# Patient Record
Sex: Male | Born: 1980 | Race: Black or African American | Hispanic: No | Marital: Single | Smoking: Current every day smoker
Health system: Southern US, Community
[De-identification: ages and names within clinical notes are randomized; demographics above are authoritative.]

## PROBLEM LIST (undated history)

## (undated) DIAGNOSIS — R519 Headache, unspecified: Secondary | ICD-10-CM

## (undated) DIAGNOSIS — T07XXXA Unspecified multiple injuries, initial encounter: Secondary | ICD-10-CM

## (undated) DIAGNOSIS — J45909 Unspecified asthma, uncomplicated: Secondary | ICD-10-CM

## (undated) DIAGNOSIS — S82891A Other fracture of right lower leg, initial encounter for closed fracture: Secondary | ICD-10-CM

## (undated) DIAGNOSIS — F319 Bipolar disorder, unspecified: Secondary | ICD-10-CM

## (undated) DIAGNOSIS — R51 Headache: Secondary | ICD-10-CM

## (undated) DIAGNOSIS — Z8709 Personal history of other diseases of the respiratory system: Secondary | ICD-10-CM

## (undated) DIAGNOSIS — R0781 Pleurodynia: Secondary | ICD-10-CM

## (undated) HISTORY — PX: COLONOSCOPY: SHX174

## (undated) HISTORY — PX: APPENDECTOMY: SHX54

---
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
## 2004-03-21 ENCOUNTER — Emergency Department (HOSPITAL_COMMUNITY): Admission: EM | Admit: 2004-03-21 | Discharge: 2004-03-21 | Payer: Self-pay | Admitting: Emergency Medicine

## 2004-11-10 ENCOUNTER — Inpatient Hospital Stay (HOSPITAL_COMMUNITY): Admission: AD | Admit: 2004-11-10 | Discharge: 2004-11-13 | Payer: Self-pay | Admitting: Psychiatry

## 2004-11-10 ENCOUNTER — Ambulatory Visit: Payer: Self-pay | Admitting: Psychiatry

## 2007-12-14 ENCOUNTER — Emergency Department (HOSPITAL_COMMUNITY): Admission: EM | Admit: 2007-12-14 | Discharge: 2007-12-14 | Payer: Self-pay | Admitting: Emergency Medicine

## 2010-01-27 ENCOUNTER — Emergency Department (HOSPITAL_COMMUNITY): Admission: EM | Admit: 2010-01-27 | Discharge: 2010-01-27 | Payer: Self-pay | Admitting: Family Medicine

## 2010-07-27 LAB — GC/CHLAMYDIA PROBE AMP, GENITAL
Chlamydia, DNA Probe: NEGATIVE
GC Probe Amp, Genital: NEGATIVE

## 2010-09-29 NOTE — H&P (Signed)
[facility]  Patient:    [name], [name] Visit Number: [id number] MRN: [id number]          Service Type: DSU Location: [facility] Attending Physician:  Twana First Dictated by:   Elana Alm Thurston Hole, M.D. Admit Date:  12/02/2001 Discharge Date: 12/03/2001                           History and Physical  PREOPERATIVE DIAGNOSES: 1. Left knee anterior cruciate ligament tear. 2. Left knee medial meniscus tear.  POSTOPERATIVE DIAGNOSES: 1. Left knee anterior cruciate ligament tear. 2. Left knee medial meniscus tear.  PROCEDURE: 1. Left knee EUA followed by arthroscopically-assisted endoscopic    bone-pateller tendon-bone autograft ACL reconstruction using 9 x 25 mm    femoral interference BioScrew and 9 x 25 mm tibial interference BioScrew. 2. Left knee medial meniscal debridement.  SURGEON: Elana Alm. Thurston Hole, M.D.  ASSISTANT:  Julien Girt, P.A.  ANESTHESIA:  General.  OPERATIVE TIME:  One hour 20 minutes.  COMPLICATIONS:  None.  INDICATION FOR PROCEDURE:  The patient is an 30 year old who sustained an injury to his left knee playing basketball approximately one month ago with a twisting pivot-shift injury.  Exam and MRI has revealed a complete ACL tear and partial medial meniscus tear.  He is now to undergo arthroscopy and ACL reconstruction.  DESCRIPTION:  The patient was brought to the operating room on December 02, 2001, placed on the operative table in a supine position.  After an adequate level of general anesthesia was obtained, his left knee was examined under anesthesia; had full range of motion, 3+ Lachman, positive pivot shift, knee stable to varus-valgus and posterior stress with normal patella tracking.  The left knee was sterilely injected with 0.25% Marcaine with epinephrine.  The left leg was prepped using sterile DuraPrep and draped using sterile technique.  Initially, the arthroscopy was performed.  Through an  inferolateral portal the arthroscope with a pump attached was placed, and through an inferomedial portal an arthroscopic probe was placed.  On initial inspection of the medial compartment, the articular cartilage, medial femoral condyle, and medial tibial plateau were found to be normal.  The medial meniscus showed a partial tear of the superior portion of the medial meniscus which was stable.  I performed a trephination procedure on this with an 18-gauge needle to improve vascularity and improve healing potential.  This piece did not need to be resected; it was stable.  The rest of the meniscus was intact.  The ACL showed a complete tear of the mid-substance with significant anterior laxity.  The posterior cruciate was intact and stable.  The ACL stumps were thoroughly debrided and a notchplasty was performed.  The lateral compartment was inspected.  The articular cartilage, lateral femoral condyle, lateral tibial plateau was normal.  The lateral meniscus was probed and this was found to be normal, as well as a the popliteus tendon.  The patellofemoral joint articular cartilage was normal.  The patella tracked normally.  The medial and lateral gutters were free of pathology.  At this point the ACL graft was harvested through a 5 cm longitudinal incision based over the patellar tendon.  The underlying subcutaneous tissues were incised in line with the skin incision.  The patellar tendon was measured.  It measured 33 mm in width and its central 10 mm was harvested with 10 x 25 mm of patellar bone and tibial tubercle bone.  After  this was done then through a 1.5 cm anterior medial proximal tibial incision using a tibial drill guide, a Steinmann pin was drilled into the ACL insertion point on the tibial plateau and then overdrilled with an 11 mm drill.  Through this hole the posterior femoral guide was placed into the posterior femoral notch, the Steinmann pin drilled in the ACL origin point,  and then overdrillled with an 11 mm dril to a depth of 28 mm.  After this was done a double pin passer was brought up through the tibial hole joint into the femoral tunnel and through the femoral cortex and thigh through a stab wound.  This was used to pass the ACL graft up through the tibial tunnel and joined into the femoral tunnel.  It was locked into position there with a 9 x 25 mm interference BioScrew.  The knee was then brought through a full range of motion.  There was found to be excellent stability of the femoral bone plug and no impingement on the graft.  The tibial bone plug was then locked into its tunnel with a 9 x 25 mm interference BioScrew.  After this was done the knee was tested for stability.  The Lachman and pivot-shift were found to be totally eliminated and the knee could be brought through a full range of motion with no impingement on the graft.  At this point it was felt that all pathology had been satisfactorily addressed. The wounds were irrigated.  The patellar tendon defect was closed loosely with 0 Vicryl, subcutaneous tissues closed with 2-0 Vicryl, subcuticular layer closed with 3-0 Prolene, Steri-Strips were applied, sterile dressings and a long-leg splint applied.  The patient then had a femoral nerve block placed by anesthesia for postoperative pain control.  He was then awakened and taken to recovery room in stable condition.  Needle and sponge counts were correct x2 at the end of the case.  FOLLOW-UP CARE:  The patient will be followed overnight at the recovery care center for IV pain control and neurovascular monitoring, CPM and ice machine used.  Discharge tomorrow on Percocet and Naprosyn with a home CPM and ice machine.  See him back in the office in a week for sutures out and follow-up. Dictated by:   Elana Alm Thurston Hole, M.D. Attending Physician:  Twana First DD:  12/02/01 TD:  12/06/01 Job: [id number]

## 2010-09-29 NOTE — H&P (Signed)
NAMEKRISTA, SOM NO.:  [id number]   MEDICAL RECORD NO.:  [id number]          PATIENT TYPE:  IPS   LOCATION:  0401                          FACILITY:  [facility]   PHYSICIAN:  Jeanice Lim, M.D. DATE OF BIRTH:  03/04/1981   DATE OF ADMISSION:  11/10/2004  DATE OF DISCHARGE:                         PSYCHIATRIC ADMISSION ASSESSMENT   IDENTIFYING INFORMATION:  This is a 30 year old single African-American  male.  Apparently his mother became concerned about him yesterday.  The  patient apparently threatened to cut his throat with a knife.  He was  extremely depressed.  The stressor was his girlfriend of 2 years breaking up  with him about 4 days ago and the girlfriend is refusing to have any  communication with him.  The patient is also on probation secondary to  weapons charge.  Details are unknown.   He has a history for depressive symptoms with no prior treatment.  His  mother petitioned for commitment.  The patient's mother feels the patient is  homicidal towards his girlfriend even though he has not done anything  threatening.  Yesterday, the patient was guarded and not answering most  questions.  There was no history of prior violence.   PAST PSYCHIATRIC HISTORY:  He has no prior inpatient or outpatient  treatment.   SOCIAL HISTORY:  He graduated high school in 2003.  He works for [employer].   FAMILY HISTORY:  He denies.   ALCOHOL AND DRUG ABUSE:  He smokes 1/2 pack of cigarettes per day, and he  states he has 2 beers a day.   PAST MEDICAL HISTORY:  Primary care Rajni Holsworth:  He does not have one.  Medical problems:  He has none.  Medications:  He is not prescribed.   ALLERGIES:  No known drug allergies.   POSITIVE PHYSICAL FINDINGS:  PHYSICAL EXAMINATION:  Unremarkable.  He is a  healthy well-nourished, well-developed African-American male who appears his  stated age of 41.   MENTAL STATUS EXAM:  He is alert and oriented x3.  He was  appropriately  groomed, dressed and nourished.  His speech was a little bit slow but  otherwise normal rate, rhythm and tone.  His mood is somewhat depressed and  anxious, appropriate to the situation.  His thought processes are clear,  rational and goal oriented.  He is requesting discharge.  Judgment and  insight are fair.  Concentration and memory are intact.  Intelligence is at  least average.  Specifically, he denies suicidal or homicidal ideation.  He  denies auditory or visual hallucinations.   ADMISSION DIAGNOSES:  AXIS I:  Adjustment disorder with depressed mood.  Rule out major depressive disorder.  AXIS II:  Deferred.  AXIS III:  None.  AXIS IV:  Severe.  Relationship issues, also on probation.  AXIS V:  Global assessment of function is 30.   PLAN:  Plan is to admit for safety and stabilization.  Toward that end, we  will start Wellbutrin XL 150 p.o. q.a.m.  This will also help him give up  the cigarettes.  We will identify outpatient therapy.  MD/MEDQ  D:  11/11/2004  T:  11/11/2004  Job:  [id number]

## 2010-09-29 NOTE — Discharge Summary (Signed)
[name], [name] NO.:  [id number]   MEDICAL RECORD NO.:  [id number]          PATIENT TYPE:  IPS   LOCATION:  0301                          [facility]ACILITY:  [facility]   PHYSICIAN:  Jeanice Lim, M.D. DATE OF BIRTH:  [birth year]   DATE OF ADMISSION:  11/10/2004  DATE OF DISCHARGE:  11/13/2004                                 DISCHARGE SUMMARY   IDENTIFYING DATA:  This is a 30 year old single African-American male.  Mother became concerned about him yesterday, apparently threatening to cut  throat with knife, extremely depressed, __________ with girlfriend of two  years, breaking up with him four days ago, girlfriend refusing to have any  communication with him.  The patient was on probation secondary to a weapon  charge.  The patient had depressive symptoms and no previous treatment.  Mother petitioner for treatment and felt the patient was homicidal toward  girlfriend even though he had not actually done anything with intent  regarding violence.   PAST PSYCHIATRIC HISTORY:  Negative.  He drinks two beers per day as per  patient.   No known drug allergies.   Physical and neurologic exam within normal limits.  Routine admission labs  within normal limits.   MENTAL STATUS EXAM:  Alert and oriented, appropriately and dressed and  groomed and nourished.  Speech a little bit slow, otherwise normal.  Mood  somewhat depressed and anxious.  Thought processes were goal-directed,  judgment and insight fair.  Denied suicidal or homicidal ideation.  Cognitively intact.   ADMISSION DIAGNOSES:  Axis I:  Adjustment disorder with depressed mood; rule  out major depressive disorder, moderate and recurrent, with no previous  treatment.  Axis II:  Deferred.  Axis III:  None.  Axis IV:  Severe, relationship issues and also on probation, legal issues.  Axis V:  GAF is 30/55-60.   The patient was admitted and ordered routine p.r.n. medications, underwent  further monitoring, and  was encouraged to participate in individual, group  and milieu therapy.  The patient was monitored for safety.  Optimized on  medications.  Depakote was optimized.  Valproic acid level checked.  Discharge planning and support system evaluated.  The patient reported  improvement in symptoms ___________ in improved condition, no dangerous  ideation.  Medication education was given again and the patient was  discharged on:   1.  Wellbutrin XR 150 mg q.a.m.  2.  Depakote ER 500 mg q.h.s.  3.  Ambien 10 mg q.h.s. p.r.n. insomnia.   Follow up with crisis emergency services on July 5 and [facility].  The patient was discharged in improved condition with no risk  issues.       JEM/MEDQ  D:  12/21/2004  T:  12/21/2004  Job:  [id number]

## 2010-11-27 ENCOUNTER — Emergency Department (HOSPITAL_COMMUNITY)
Admission: EM | Admit: 2010-11-27 | Discharge: 2010-11-27 | Disposition: A | Discharge status: Home / Self Care | Payer: Self-pay | Attending: Emergency Medicine | Admitting: Emergency Medicine

## 2010-11-27 DIAGNOSIS — L0231 Cutaneous abscess of buttock: Secondary | ICD-10-CM | POA: Insufficient documentation

## 2012-10-15 ENCOUNTER — Encounter (HOSPITAL_COMMUNITY): Payer: Self-pay | Admitting: Emergency Medicine

## 2012-10-15 ENCOUNTER — Emergency Department (HOSPITAL_COMMUNITY)
Admission: EM | Admit: 2012-10-15 | Discharge: 2012-10-15 | Disposition: A | Discharge status: Home / Self Care | Payer: Self-pay | Attending: Emergency Medicine | Admitting: Emergency Medicine

## 2012-10-15 DIAGNOSIS — F17200 Nicotine dependence, unspecified, uncomplicated: Secondary | ICD-10-CM | POA: Insufficient documentation

## 2012-10-15 DIAGNOSIS — K625 Hemorrhage of anus and rectum: Secondary | ICD-10-CM

## 2012-10-15 DIAGNOSIS — K921 Melena: Secondary | ICD-10-CM | POA: Insufficient documentation

## 2012-10-15 LAB — CBC
Hemoglobin: 15 g/dL (ref 13.0–17.0)
Platelets: 290 10*3/uL (ref 150–400)
RBC: 4.84 MIL/uL (ref 4.22–5.81)
WBC: 6.3 10*3/uL (ref 4.0–10.5)

## 2012-10-15 LAB — COMPREHENSIVE METABOLIC PANEL
ALT: 11 U/L (ref 0–53)
AST: 16 U/L (ref 0–37)
Alkaline Phosphatase: 74 U/L (ref 39–117)
CO2: 30 mEq/L (ref 19–32)
GFR calc Af Amer: >90 mL/min (ref >90)
GFR calc non Af Amer: >90 mL/min (ref >90)
Glucose, Bld: 74 mg/dL (ref 70–99)
Potassium: 3.7 mEq/L (ref 3.5–5.1)
Sodium: 139 mEq/L (ref 135–145)

## 2012-10-15 LAB — OCCULT BLOOD, POC DEVICE: Fecal Occult Bld: POSITIVE — AB

## 2012-10-15 NOTE — ED Notes (Signed)
Per patient, feels weak and slight dizziness

## 2012-10-15 NOTE — ED Provider Notes (Signed)
History     CSN: [id number]  Arrival date & time 10/15/12  1814   First MD Initiated Contact with Patient 10/15/12 1823      Chief Complaint  Patient presents with  . Rectal Bleeding    (Consider location/radiation/quality/duration/timing/severity/associated sxs/prior treatment) Patient is a 32 y.o. male presenting with hematochezia. The history is provided by the patient and medical records. No language interpreter was used.  Rectal Bleeding Quality:  Bright red Amount:  Moderate Duration:  2 days Timing:  Intermittent Progression:  Worsening Chronicity:  New Context: defecation   Context: not anal fissures, not anal penetration, not constipation, not diarrhea, not foreign body, not hemorrhoids, not rectal injury, not rectal pain and not spontaneously   Similar prior episodes: no   Relieved by:  Nothing Worsened by:  Defecation Ineffective treatments:  Time Associated symptoms: no abdominal pain, no dizziness, no epistaxis, no fever, no hematemesis, no light-headedness, no loss of consciousness, no recent illness and no vomiting   Risk factors: no anticoagulant use, no hx of colorectal cancer, no hx of colorectal surgery, no hx of IBD, no liver disease, no NSAID use and no steroid use     JAEVIN MEDEARIS is a 32 y.o. male  with no known medical history presents to the Emergency Department complaining of gradual, persistent, progressively worsening bright red blood per rectum with defecation only on for 2 days ago. Patient denies constipation, hard stools, straining with BMs.  Pt states he normally has several bowel movements each day, which are soft in nature and have not changed.  Pt states painless BRBPR for 2 days without associated N/V/D, weakness, dizziness, abdominal pain, SOB, palpitations. Nothing makes the symptoms better or worse.  He denise personal of family Hx of Lupus, Chron's, UC or other autoimmune diseases.  Pt takes ibuprofen or Goody's powder for headache on  occasion but less than 3x per week.  Pt also denies hematuria, bleeding gums, easy bruising/bleeding.  Pt denies fever, chills, headache, CP, night sweats, weight loss, rectal itching or purulent discharge.  History reviewed. No pertinent past medical history.  Past Surgical History  Procedure Laterality Date  . Appendectomy      History reviewed. No pertinent family history.  History  Substance Use Topics  . Smoking status: Current Some Day Smoker  . Smokeless tobacco: Not on file  . Alcohol Use: Yes      Review of Systems  Constitutional: Negative for fever, diaphoresis, appetite change, fatigue and unexpected weight change.  HENT: Negative for nosebleeds, mouth sores and neck stiffness.   Eyes: Negative for visual disturbance.  Respiratory: Negative for cough, chest tightness, shortness of breath and wheezing.   Cardiovascular: Negative for chest pain.  Gastrointestinal: Positive for blood in stool, hematochezia and anal bleeding. Negative for nausea, vomiting, abdominal pain, diarrhea, constipation, abdominal distention, rectal pain and hematemesis.  Endocrine: Negative for polydipsia, polyphagia and polyuria.  Genitourinary: Negative for dysuria, urgency, frequency and hematuria.  Musculoskeletal: Negative for back pain.  Skin: Negative for rash.  Allergic/Immunologic: Negative for immunocompromised state.  Neurological: Negative for dizziness, loss of consciousness, syncope, light-headedness and headaches.  Hematological: Does not bruise/bleed easily.  Psychiatric/Behavioral: Negative for sleep disturbance. The patient is not nervous/anxious.     Allergies  Review of patient's allergies indicates no known allergies.  Home Medications  No current outpatient prescriptions on file.  BP 125/85  Pulse 88  Temp(Src) 99.7 F (37.6 C) (Oral)  Resp 16  SpO2 100%  Physical Exam  Nursing  note and vitals reviewed. Constitutional: He is oriented to person, place, and  time. He appears well-developed and well-nourished. No distress.  HENT:  Head: Normocephalic and atraumatic.  Mouth/Throat: Oropharynx is clear and moist. No oropharyngeal exudate.  Eyes: Conjunctivae are normal. Pupils are equal, round, and reactive to light. No scleral icterus.  Neck: Normal range of motion. Neck supple.  Cardiovascular: Normal rate, regular rhythm, normal heart sounds and intact distal pulses.   No murmur heard. Pulmonary/Chest: Effort normal and breath sounds normal. No respiratory distress. He has no wheezes. He has no rales. He exhibits no tenderness.  Abdominal: Soft. Bowel sounds are normal. He exhibits no distension and no mass. There is no tenderness. There is no rebound and no guarding.  Genitourinary: Rectal exam shows no external hemorrhoid, no internal hemorrhoid, no fissure, no mass, no tenderness and anal tone normal. Guaiac positive stool. Prostate is not enlarged and not tender.  External: no skin changes, fissures, scars, or hemorrhoids noted Internal: no masses, induration, point tenderness; Prostate is not enlarged, boggy or tender Bright red blood per rectum on rectal exam   Musculoskeletal: Normal range of motion. He exhibits no edema.  Neurological: He is alert and oriented to person, place, and time. He exhibits normal muscle tone. Coordination normal.  Speech is clear and goal oriented Moves extremities without ataxia  Skin: Skin is warm and dry. No rash noted. He is not diaphoretic. No erythema.  Psychiatric: He has a normal mood and affect.    ED Course  Procedures (including critical care time)  Labs Reviewed  OCCULT BLOOD, POC DEVICE - Abnormal; Notable for the following:    Fecal Occult Bld POSITIVE (*)    All other components within normal limits  CBC  COMPREHENSIVE METABOLIC PANEL  PROTIME-INR  APTT   No results found.   1. Bright red rectal bleeding       MDM  Rise Patience presents with painless BRBPR.  Likely internal  hemorrhoids; but will consider diverticulosis, but this is unlikely in pt age.  Also consider UC or Chron's but pt is without abdominal pain and these are unlikely.  No internal hemorrhoids felt on exam, but pt fear of the exam made it difficult.  NO hard stool in rectal vault and I do not believe pt is constipated or has an impaction.  Labs and vitals reviewed. Pt is not tachycardic, febrile or orthostatic.  Pt is without anemia, electrolyte imbalance or clotting disorder.   I reviewed available ER/hospitalization records through the EMR.  Pt is to follow-up with GI for further evaluation.  I have also discussed reasons to return immediately to the ER.  Patient expresses understanding and agrees with plan.           Dahlia Client Ziona Wickens, PA-C 10/15/12 2053

## 2012-10-15 NOTE — ED Provider Notes (Signed)
Medical screening examination/treatment/procedure(s) were performed by non-physician practitioner and as supervising physician I was immediately available for consultation/collaboration.   Sederick Jacobsen R Dorien Mayotte, MD 10/15/12 2058 

## 2012-10-15 NOTE — ED Notes (Signed)
Per patient, having bright red blood with each BM, no abdominal pain/cramping

## 2012-10-16 ENCOUNTER — Encounter: Payer: Self-pay | Admitting: Internal Medicine

## 2012-10-16 ENCOUNTER — Telehealth: Payer: Self-pay | Admitting: Internal Medicine

## 2012-10-16 NOTE — Telephone Encounter (Signed)
Pt seen in ER yesterday for BRB x 2 days with BM's; guaiac + in ER. ER notes state pt takes Goody's powder for HA and ibuprofen. Mom initiated the call and stated pt just "pulled 18 months" and has no insurance. Checked with Darcey Nora, RN and we are on unassigned call at [facility] this month. Pt given an appt with Mike Gip, PA in am and was instructed he should bring some part of $184; mom stated understanding.

## 2012-10-17 ENCOUNTER — Other Ambulatory Visit: Payer: Self-pay | Admitting: *Deleted

## 2012-10-17 ENCOUNTER — Encounter: Payer: Self-pay | Admitting: Physician Assistant

## 2012-10-17 ENCOUNTER — Ambulatory Visit (INDEPENDENT_AMBULATORY_CARE_PROVIDER_SITE_OTHER): Payer: Self-pay | Admitting: Physician Assistant

## 2012-10-17 VITALS — BP 100/70 | HR 60 | Ht 71.0 in | Wt 197.0 lb

## 2012-10-17 DIAGNOSIS — R109 Unspecified abdominal pain: Secondary | ICD-10-CM

## 2012-10-17 DIAGNOSIS — K625 Hemorrhage of anus and rectum: Secondary | ICD-10-CM

## 2012-10-17 MED ORDER — MOVIPREP 100 G PO SOLR: 1.0000 | Freq: Once | ORAL | Status: DC

## 2012-10-17 NOTE — Progress Notes (Signed)
Subjective:    Patient ID: [name], male    DOB: 28-Jan-1981, 32 y.o.   MRN: [id number]  HPI  [name] is a generally healthy 32 year old African American male who was referred here after an ER visit on 10/15/2012 for rectal bleeding . He says he has no history of any GI issues. About a week ago he had a sharp pain in his low mid abdomen which lasted for several minutes then resolved. He says been noticing intermittent mild lower abdominal  discomfort below his navel. He had an episode on Monday, 10/13/2012 with a bowel movement which had a lot of bright red blood mixed in with it and bright red blood noted in the commode. He says the stool was brown. He had a second episode on Wednesday, 10/14/2012 again with a formed brown bowel movement with bright red blood mixed in and blood in the commode. He had a second bowel movement that same day which was just blood. Bowel movement yesterday without any visible blood and has not had a bowel movement today. He has not had any rectal discomfort pressure etc. no changes in his bowel habits constipation straining etc. He does not use any regular aspirin or NSAIDs. His family history is negative for colon cancer polyps as far as he does wear. Evaluation in the emergency room with labs showed a WBC of 6.3 hemoglobin 15 hematocrit of 43.4 and MCV of 89    Review of Systems  Constitutional: Negative.   HENT: Negative.   Eyes: Negative.   Respiratory: Negative.   Cardiovascular: Negative.   Gastrointestinal: Positive for abdominal pain and blood in stool.  Endocrine: Negative.   Genitourinary: Negative.   Musculoskeletal: Negative.   Allergic/Immunologic: Negative.   Neurological: Negative.   Hematological: Negative.   Psychiatric/Behavioral: Negative.    No outpatient encounter prescriptions on file as of 10/17/2012.   No facility-administered encounter medications on file as of 10/17/2012.       No Known Allergies There are no active problems to  display for this patient. Healthy family history is negative for Colon cancer and Colon polyps. History  Substance Use Topics  . Smoking status: Current Every Day Smoker  . Smokeless tobacco: Never Used  . Alcohol Use: No     Comment: former drinker but right now not drinking      Objective:   Physical Exam  well-developed African American male in no acute distress. Blood pressure 100/70 pulse 60 height 5 foot 11 weight 197. HEENT; nontraumatic normocephalic EOMI PERRLA sclera anicteric,Neck; Supple no JVD, Cardiovascular; regular rate and rhythm with S1-S2 no murmur or gallop, Pulm; clear bilaterally, Abdomen ;soft basically nontender, there is no palpable mass or hepatosplenomegaly, bowel sounds are present, Recta;l exam not repeated this was done in the ER on 10/15/2012 with bright red blood noted on exam glove in no external hemorrhoids noted., Extremities ;no clubbing cyanosis or edema skin warm and dry, Psych ;mood and affect normal and appropriate.        Assessment & Plan:  #36 32 year old generally healthy African American male with recent intermittent lower abdominal pain and 4-5 day history of intermittent bright red blood mixed with his bowel movements. Etiology of bleeding is not clear, this may represent internal hemorrhoidal bleeding however with blood mixed in with the bowel movement will need to rule out proctitis/colitis and/or occult colon lesion.  Plan; Will start Anusol-HC suppositories empirically at bedtime x7 days then as needed Schedule for colonoscopy with Dr. Kathlee Nations discussed in  detail with the patient and he is agreeable to proceed. Patient is advised that if he continues to have significant amounts of blood noted in his stool prior to his colonoscopy to call back and at that point would need at least repeat labs.

## 2012-10-17 NOTE — Progress Notes (Signed)
I agree with the plan in this note.

## 2012-10-21 ENCOUNTER — Encounter: Payer: Self-pay | Admitting: Gastroenterology

## 2012-10-21 ENCOUNTER — Ambulatory Visit (AMBULATORY_SURGERY_CENTER): Payer: Self-pay | Admitting: Gastroenterology

## 2012-10-21 VITALS — BP 100/54 | HR 62 | Temp 97.5°F | Resp 16 | Ht 71.0 in | Wt 197.0 lb

## 2012-10-21 DIAGNOSIS — K648 Other hemorrhoids: Secondary | ICD-10-CM

## 2012-10-21 DIAGNOSIS — R109 Unspecified abdominal pain: Secondary | ICD-10-CM

## 2012-10-21 DIAGNOSIS — K625 Hemorrhage of anus and rectum: Secondary | ICD-10-CM

## 2012-10-21 DIAGNOSIS — K644 Residual hemorrhoidal skin tags: Secondary | ICD-10-CM

## 2012-10-21 MED ORDER — SODIUM CHLORIDE 0.9 % IV SOLN: 500.0000 mL | INTRAVENOUS | Status: DC

## 2012-10-21 NOTE — Progress Notes (Signed)
Patient did not have preoperative order for IV antibiotic SSI prophylaxis. (G8918)  Patient did not experience any of the following events: a burn prior to discharge; a fall within the facility; wrong site/side/patient/procedure/implant event; or a hospital transfer or hospital admission upon discharge from the facility. (G8907)  

## 2012-10-21 NOTE — Patient Instructions (Addendum)
One of your biggest health concerns is your smoking.  This increases your risk for most cancers and serious cardiovascular diseases such as strokes, heart attacks.  You should try your best to stop.  If you need assistance, please contact your PCP or Smoking Cessation Class at [place] ([phone]) or [place] (1-800-QUIT-NOW).  YOU HAD AN ENDOSCOPIC PROCEDURE TODAY AT THE [facility]: Refer to the procedure report that was given to you for any specific questions about what was found during the examination.  If the procedure report does not answer your questions, please call your gastroenterologist to clarify.  If you requested that your care partner not be given the details of your procedure findings, then the procedure report has been included in a sealed envelope for you to review at your convenience later.  YOU SHOULD EXPECT: Some feelings of bloating in the abdomen. Passage of more gas than usual.  Walking can help get rid of the air that was put into your GI tract during the procedure and reduce the bloating. If you had a lower endoscopy (such as a colonoscopy or flexible sigmoidoscopy) you may notice spotting of blood in your stool or on the toilet paper. If you underwent a bowel prep for your procedure, then you may not have a normal bowel movement for a few days.  DIET: Your first meal following the procedure should be a light meal and then it is ok to progress to your normal diet.  A half-sandwich or bowl of soup is an example of a good first meal.  Heavy or fried foods are harder to digest and may make you feel nauseous or bloated.  Likewise meals heavy in dairy and vegetables can cause extra gas to form and this can also increase the bloating.  Drink plenty of fluids but you should avoid alcoholic beverages for 24 hours.  ACTIVITY: Your care partner should take you home directly after the procedure.  You should plan to take it easy, moving slowly for the rest of  the day.  You can resume normal activity the day after the procedure however you should NOT DRIVE or use heavy machinery for 24 hours (because of the sedation medicines used during the test).    SYMPTOMS TO REPORT IMMEDIATELY: A gastroenterologist can be reached at any hour.  During normal business hours, 8:30 AM to 5:00 PM Monday through Friday, call [phone].  After hours and on weekends, please call the GI answering service at [phone] who will take a message and have the physician on call contact you.   Following lower endoscopy (colonoscopy or flexible sigmoidoscopy):  Excessive amounts of blood in the stool  Significant tenderness or worsening of abdominal pains  Swelling of the abdomen that is new, acute  Fever of 100F or higher  FOLLOW UP: If any biopsies were taken you will be contacted by phone or by letter within the next 1-3 weeks.  Call your gastroenterologist if you have not heard about the biopsies in 3 weeks.  Our staff will call the home number listed on your records the next business day following your procedure to check on you and address any questions or concerns that you may have at that time regarding the information given to you following your procedure. This is a courtesy call and so if there is no answer at the home number and we have not heard from you through the emergency physician on call, we will assume that you have returned to   your regular daily activities without incident.  SIGNATURES/CONFIDENTIALITY: You and/or your care partner have signed paperwork which will be entered into your electronic medical record.  These signatures attest to the fact that that the information above on your After Visit Summary has been reviewed and is understood.  Full responsibility of the confidentiality of this discharge information lies with you and/or your care-partner. 

## 2012-10-21 NOTE — Op Note (Signed)
[facility] [address] [place], [address]   COLONOSCOPY PROCEDURE REPORT  PATIENT: [name], [name]  MR#: [id number] BIRTHDATE: 06-23-1980 , 31  yrs. old GENDER: Male ENDOSCOPIST: Rachael Fee, MD PROCEDURE DATE:  10/21/2012 PROCEDURE:   Colonoscopy, diagnostic ASA CLASS:   Class II INDICATIONS:recent minor rectal bleeding and lower abdominal discomfort. MEDICATIONS: Fentanyl 75 mcg IV, Versed 7 mg IV, and These medications were titrated to patient response per physician's verbal order  DESCRIPTION OF PROCEDURE:   After the risks benefits and alternatives of the procedure were thoroughly explained, informed consent was obtained.  A digital rectal exam revealed no abnormalities of the rectum.   The LB [id number]  endoscope was introduced through the anus and advanced to the cecum, which was identified by both the appendix and ileocecal valve. No adverse events experienced.   The quality of the prep was good.  The instrument was then slowly withdrawn as the colon was fully examined.   COLON FINDINGS: A normal appearing cecum, ileocecal valve, and appendiceal orifice were identified.  The ascending, hepatic flexure, transverse, splenic flexure, descending, sigmoid colon and rectum appeared unremarkable.  No polyps or cancers were seen. Retroflexed views revealed internal/external hemorrhoids. The time to cecum=3 minutes 43 seconds.  Withdrawal time=5 minutes 10 seconds.  The scope was withdrawn and the procedure completed. COMPLICATIONS: There were no complications.  ENDOSCOPIC IMPRESSION: Normal colon Hemorrhoids; these are the likely source of your recent bleeding  RECOMMENDATIONS: Please call if you have repeated abdominal pains, bleeding.   eSigned:  Rachael Fee, MD 10/21/2012 2:01 PM   cc:

## 2012-10-22 ENCOUNTER — Telehealth: Payer: Self-pay

## 2012-10-22 NOTE — Telephone Encounter (Signed)
Left message on answering machine. 

## 2012-11-06 ENCOUNTER — Ambulatory Visit: Payer: Self-pay | Admitting: Internal Medicine

## 2014-01-24 ENCOUNTER — Inpatient Hospital Stay (HOSPITAL_COMMUNITY)
Admission: EM | Admit: 2014-01-24 | Discharge: 2014-01-28 | DRG: 563 | Disposition: A | Discharge status: Rehabilitation Facility | Payer: Self-pay | Attending: General Surgery | Admitting: General Surgery

## 2014-01-24 ENCOUNTER — Emergency Department (HOSPITAL_COMMUNITY): Payer: Self-pay

## 2014-01-24 ENCOUNTER — Encounter (HOSPITAL_COMMUNITY): Payer: Self-pay | Admitting: Emergency Medicine

## 2014-01-24 DIAGNOSIS — S92101A Unspecified fracture of right talus, initial encounter for closed fracture: Secondary | ICD-10-CM | POA: Diagnosis present

## 2014-01-24 DIAGNOSIS — S92001A Unspecified fracture of right calcaneus, initial encounter for closed fracture: Secondary | ICD-10-CM | POA: Diagnosis present

## 2014-01-24 DIAGNOSIS — S82891A Other fracture of right lower leg, initial encounter for closed fracture: Secondary | ICD-10-CM

## 2014-01-24 DIAGNOSIS — S82831A Other fracture of upper and lower end of right fibula, initial encounter for closed fracture: Secondary | ICD-10-CM

## 2014-01-24 DIAGNOSIS — I959 Hypotension, unspecified: Secondary | ICD-10-CM | POA: Diagnosis present

## 2014-01-24 DIAGNOSIS — S51809A Unspecified open wound of unspecified forearm, initial encounter: Secondary | ICD-10-CM | POA: Diagnosis present

## 2014-01-24 DIAGNOSIS — F10931 Alcohol use, unspecified with withdrawal delirium: Secondary | ICD-10-CM | POA: Diagnosis present

## 2014-01-24 DIAGNOSIS — E872 Acidosis, unspecified: Secondary | ICD-10-CM | POA: Diagnosis present

## 2014-01-24 DIAGNOSIS — S92009A Unspecified fracture of unspecified calcaneus, initial encounter for closed fracture: Secondary | ICD-10-CM | POA: Diagnosis present

## 2014-01-24 DIAGNOSIS — S60219A Contusion of unspecified wrist, initial encounter: Secondary | ICD-10-CM | POA: Diagnosis present

## 2014-01-24 DIAGNOSIS — S92109A Unspecified fracture of unspecified talus, initial encounter for closed fracture: Secondary | ICD-10-CM | POA: Diagnosis present

## 2014-01-24 DIAGNOSIS — F10231 Alcohol dependence with withdrawal delirium: Secondary | ICD-10-CM | POA: Diagnosis present

## 2014-01-24 DIAGNOSIS — T07XXXA Unspecified multiple injuries, initial encounter: Secondary | ICD-10-CM

## 2014-01-24 DIAGNOSIS — S92309A Fracture of unspecified metatarsal bone(s), unspecified foot, initial encounter for closed fracture: Secondary | ICD-10-CM | POA: Diagnosis present

## 2014-01-24 DIAGNOSIS — F10929 Alcohol use, unspecified with intoxication, unspecified: Secondary | ICD-10-CM | POA: Diagnosis present

## 2014-01-24 DIAGNOSIS — IMO0002 Reserved for concepts with insufficient information to code with codable children: Secondary | ICD-10-CM | POA: Diagnosis present

## 2014-01-24 DIAGNOSIS — S51811A Laceration without foreign body of right forearm, initial encounter: Secondary | ICD-10-CM | POA: Diagnosis present

## 2014-01-24 DIAGNOSIS — S8263XA Displaced fracture of lateral malleolus of unspecified fibula, initial encounter for closed fracture: Principal | ICD-10-CM | POA: Diagnosis present

## 2014-01-24 DIAGNOSIS — F1010 Alcohol abuse, uncomplicated: Secondary | ICD-10-CM | POA: Diagnosis present

## 2014-01-24 DIAGNOSIS — S92253A Displaced fracture of navicular [scaphoid] of unspecified foot, initial encounter for closed fracture: Secondary | ICD-10-CM | POA: Diagnosis present

## 2014-01-24 HISTORY — DX: Rider (driver) (passenger) of other motorcycle injured in unspecified traffic accident, initial encounter: V29.99XA

## 2014-01-24 HISTORY — DX: Other fracture of right lower leg, initial encounter for closed fracture: S82.891A

## 2014-01-24 HISTORY — DX: Unspecified asthma, uncomplicated: J45.909

## 2014-01-24 HISTORY — DX: Unspecified multiple injuries, initial encounter: T07.XXXA

## 2014-01-24 LAB — COMPREHENSIVE METABOLIC PANEL
ALBUMIN: 3.8 g/dL (ref 3.5–5.2)
ALT: 17 U/L (ref 0–53)
AST: 31 U/L (ref 0–37)
Alkaline Phosphatase: 72 U/L (ref 39–117)
Anion gap: 21 — ABNORMAL HIGH (ref 5–15)
BUN: 10 mg/dL (ref 6–23)
CALCIUM: 8.5 mg/dL (ref 8.4–10.5)
CHLORIDE: 100 meq/L (ref 96–112)
CO2: 19 mEq/L (ref 19–32)
Creatinine, Ser: 0.96 mg/dL (ref 0.50–1.35)
GFR calc Af Amer: >90 mL/min (ref >90)
GFR calc non Af Amer: >90 mL/min (ref >90)
Glucose, Bld: 116 mg/dL — ABNORMAL HIGH (ref 70–99)
Potassium: 3.7 mEq/L (ref 3.7–5.3)
SODIUM: 140 meq/L (ref 137–147)
Total Bilirubin: 0.3 mg/dL (ref 0.3–1.2)
Total Protein: 6.9 g/dL (ref 6.0–8.3)

## 2014-01-24 LAB — CBC
HEMATOCRIT: 43.6 % (ref 39.0–52.0)
Hemoglobin: 15.9 g/dL (ref 13.0–17.0)
MCH: 33.8 pg (ref 26.0–34.0)
MCHC: 36.5 g/dL — ABNORMAL HIGH (ref 30.0–36.0)
MCV: 92.8 fL (ref 78.0–100.0)
Platelets: 313 10*3/uL (ref 150–400)
RBC: 4.7 MIL/uL (ref 4.22–5.81)
RDW: 11.9 % (ref 11.5–15.5)
WBC: 10.6 10*3/uL — ABNORMAL HIGH (ref 4.0–10.5)

## 2014-01-24 LAB — ETHANOL: Alcohol, Ethyl (B): 154 mg/dL — ABNORMAL HIGH (ref 0–11)

## 2014-01-24 LAB — TYPE AND SCREEN
ABO/RH(D): B POS
Antibody Screen: NEGATIVE
Unit division: 0
Unit division: 0

## 2014-01-24 LAB — PROTIME-INR
INR: 1.09 (ref 0.00–1.49)
Prothrombin Time: 14.1 seconds (ref 11.6–15.2)

## 2014-01-24 LAB — I-STAT CHEM 8, ED
BUN: 8 mg/dL (ref 6–23)
CHLORIDE: 103 meq/L (ref 96–112)
Calcium, Ion: 1.05 mmol/L — ABNORMAL LOW (ref 1.12–1.23)
Creatinine, Ser: 1.2 mg/dL (ref 0.50–1.35)
Glucose, Bld: 116 mg/dL — ABNORMAL HIGH (ref 70–99)
HEMATOCRIT: 49 % (ref 39.0–52.0)
Hemoglobin: 16.7 g/dL (ref 13.0–17.0)
POTASSIUM: 3.3 meq/L — AB (ref 3.7–5.3)
SODIUM: 140 meq/L (ref 137–147)
TCO2: 19 mmol/L (ref 0–100)

## 2014-01-24 LAB — PREPARE FRESH FROZEN PLASMA
UNIT DIVISION: 0
Unit division: 0

## 2014-01-24 LAB — CDS SEROLOGY

## 2014-01-24 LAB — I-STAT CG4 LACTIC ACID, ED: LACTIC ACID, VENOUS: 7.19 mmol/L — AB (ref 0.5–2.2)

## 2014-01-24 MED ORDER — MORPHINE SULFATE 2 MG/ML IJ SOLN
2.0000 mg | Freq: Once | INTRAMUSCULAR | Status: AC
  Administered 2014-01-24: 2 mg via INTRAVENOUS

## 2014-01-24 MED ORDER — PANTOPRAZOLE SODIUM 40 MG PO TBEC: 40.0000 mg | DELAYED_RELEASE_TABLET | Freq: Every day | ORAL | Status: DC

## 2014-01-24 MED ORDER — FENTANYL CITRATE 0.05 MG/ML IJ SOLN
100.0000 ug | Freq: Once | INTRAMUSCULAR | Status: AC
  Administered 2014-01-24: 100 ug via INTRAVENOUS
  Filled 2014-01-24: qty 2

## 2014-01-24 MED ORDER — ENOXAPARIN SODIUM 40 MG/0.4ML ~~LOC~~ SOLN
40.0000 mg | SUBCUTANEOUS | Status: DC
  Administered 2014-01-25: 40 mg via SUBCUTANEOUS
  Filled 2014-01-24 (×2): qty 0.4

## 2014-01-24 MED ORDER — HYDROMORPHONE HCL PF 1 MG/ML IJ SOLN
1.0000 mg | INTRAMUSCULAR | Status: DC | PRN
  Administered 2014-01-25 (×3): 1 mg via INTRAVENOUS
  Filled 2014-01-24 (×3): qty 1

## 2014-01-24 MED ORDER — OXYCODONE HCL 5 MG PO TABS
10.0000 mg | ORAL_TABLET | ORAL | Status: DC | PRN
  Administered 2014-01-25 (×2): 10 mg via ORAL
  Filled 2014-01-24 (×2): qty 2

## 2014-01-24 MED ORDER — PANTOPRAZOLE SODIUM 40 MG IV SOLR
40.0000 mg | Freq: Every day | INTRAVENOUS | Status: DC
  Filled 2014-01-24: qty 40

## 2014-01-24 MED ORDER — DEXTROSE-NACL 5-0.9 % IV SOLN
INTRAVENOUS | Status: DC
  Administered 2014-01-25: 01:00:00 via INTRAVENOUS

## 2014-01-24 MED ORDER — ONDANSETRON HCL 4 MG PO TABS: 4.0000 mg | ORAL_TABLET | Freq: Four times a day (QID) | ORAL | Status: DC | PRN

## 2014-01-24 MED ORDER — MORPHINE SULFATE 2 MG/ML IJ SOLN
INTRAMUSCULAR | Status: AC
  Filled 2014-01-24: qty 1

## 2014-01-24 MED ORDER — ONDANSETRON HCL 4 MG/2ML IJ SOLN
4.0000 mg | Freq: Four times a day (QID) | INTRAMUSCULAR | Status: DC | PRN
  Administered 2014-01-25: 4 mg via INTRAVENOUS
  Filled 2014-01-24: qty 2
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
## 2014-01-24 NOTE — Progress Notes (Signed)
Orthopedic Tech Progress Note Patient Details:  [name] 1981-03-24 [id number]  Ortho Devices Type of Ortho Device: Ace wrap;Post (short leg) splint;Stirrup splint Ortho Device/Splint Location: rle Ortho Device/Splint Interventions: Application As ordered by Dr. Dimple Nanas, Kofi Murrell 01/24/2014, 9:57 PM
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
## 2014-01-24 NOTE — ED Notes (Signed)
MD at bedside. 

## 2014-01-25 ENCOUNTER — Inpatient Hospital Stay (HOSPITAL_COMMUNITY): Payer: Self-pay

## 2014-01-25 DIAGNOSIS — S92101A Unspecified fracture of right talus, initial encounter for closed fracture: Secondary | ICD-10-CM | POA: Diagnosis present

## 2014-01-25 DIAGNOSIS — S92001A Unspecified fracture of right calcaneus, initial encounter for closed fracture: Secondary | ICD-10-CM | POA: Diagnosis present

## 2014-01-25 DIAGNOSIS — T07XXXA Unspecified multiple injuries, initial encounter: Secondary | ICD-10-CM | POA: Diagnosis present

## 2014-01-25 DIAGNOSIS — F10929 Alcohol use, unspecified with intoxication, unspecified: Secondary | ICD-10-CM | POA: Diagnosis present

## 2014-01-25 DIAGNOSIS — S51811A Laceration without foreign body of right forearm, initial encounter: Secondary | ICD-10-CM | POA: Diagnosis present

## 2014-01-25 LAB — COMPREHENSIVE METABOLIC PANEL
ALT: 31 U/L (ref 0–53)
AST: 111 U/L — AB (ref 0–37)
Albumin: 3.7 g/dL (ref 3.5–5.2)
Alkaline Phosphatase: 67 U/L (ref 39–117)
Anion gap: 14 (ref 5–15)
BUN: 10 mg/dL (ref 6–23)
CALCIUM: 8.7 mg/dL (ref 8.4–10.5)
CHLORIDE: 103 meq/L (ref 96–112)
CO2: 21 mEq/L (ref 19–32)
Creatinine, Ser: 0.91 mg/dL (ref 0.50–1.35)
GFR calc Af Amer: >90 mL/min (ref >90)
Glucose, Bld: 138 mg/dL — ABNORMAL HIGH (ref 70–99)
Potassium: 4.4 mEq/L (ref 3.7–5.3)
SODIUM: 138 meq/L (ref 137–147)
Total Bilirubin: 0.8 mg/dL (ref 0.3–1.2)
Total Protein: 6.4 g/dL (ref 6.0–8.3)

## 2014-01-25 LAB — CBC
HCT: 40.9 % (ref 39.0–52.0)
Hemoglobin: 14.9 g/dL (ref 13.0–17.0)
MCH: 33.5 pg (ref 26.0–34.0)
MCHC: 36.4 g/dL — ABNORMAL HIGH (ref 30.0–36.0)
MCV: 91.9 fL (ref 78.0–100.0)
Platelets: 260 10*3/uL (ref 150–400)
RBC: 4.45 MIL/uL (ref 4.22–5.81)
RDW: 12 % (ref 11.5–15.5)
WBC: 15.2 10*3/uL — AB (ref 4.0–10.5)

## 2014-01-25 LAB — ABO/RH: ABO/RH(D): B POS

## 2014-01-25 MED ORDER — ENOXAPARIN SODIUM 30 MG/0.3ML ~~LOC~~ SOLN
30.0000 mg | Freq: Two times a day (BID) | SUBCUTANEOUS | Status: DC
  Administered 2014-01-26 – 2014-01-28 (×5): 30 mg via SUBCUTANEOUS
  Filled 2014-01-25 (×8): qty 0.3

## 2014-01-25 MED ORDER — POLYETHYLENE GLYCOL 3350 17 G PO PACK
17.0000 g | PACK | Freq: Every day | ORAL | Status: DC
  Administered 2014-01-26 – 2014-01-28 (×3): 17 g via ORAL
  Filled 2014-01-25 (×5): qty 1

## 2014-01-25 MED ORDER — OXYCODONE HCL 5 MG PO TABS
10.0000 mg | ORAL_TABLET | ORAL | Status: DC | PRN
  Administered 2014-01-25 (×2): 20 mg via ORAL
  Administered 2014-01-25: 15 mg via ORAL
  Administered 2014-01-25 – 2014-01-26 (×4): 20 mg via ORAL
  Administered 2014-01-26 – 2014-01-27 (×2): 15 mg via ORAL
  Administered 2014-01-27 – 2014-01-28 (×4): 20 mg via ORAL
  Filled 2014-01-25 (×5): qty 4
  Filled 2014-01-25: qty 3
  Filled 2014-01-25 (×3): qty 4
  Filled 2014-01-25: qty 3
  Filled 2014-01-25: qty 4
  Filled 2014-01-25: qty 3
  Filled 2014-01-25: qty 4

## 2014-01-25 MED ORDER — DIPHENHYDRAMINE HCL 25 MG PO CAPS
50.0000 mg | ORAL_CAPSULE | Freq: Four times a day (QID) | ORAL | Status: DC | PRN
  Administered 2014-01-25 – 2014-01-26 (×3): 50 mg via ORAL
  Filled 2014-01-25 (×3): qty 2

## 2014-01-25 MED ORDER — HYDROMORPHONE HCL PF 1 MG/ML IJ SOLN
1.0000 mg | INTRAMUSCULAR | Status: DC | PRN
  Administered 2014-01-25 – 2014-01-26 (×3): 1 mg via INTRAVENOUS
  Filled 2014-01-25 (×2): qty 1

## 2014-01-25 MED ORDER — BACITRACIN ZINC 500 UNIT/GM EX OINT
1.0000 "application " | TOPICAL_OINTMENT | Freq: Two times a day (BID) | CUTANEOUS | Status: DC
  Administered 2014-01-25 – 2014-01-28 (×6): 1 via TOPICAL
  Filled 2014-01-25: qty 28.35

## 2014-01-25 MED ORDER — HYDROMORPHONE HCL PF 1 MG/ML IJ SOLN
INTRAMUSCULAR | Status: AC
  Filled 2014-01-25: qty 1

## 2014-01-25 MED ORDER — DOCUSATE SODIUM 100 MG PO CAPS
100.0000 mg | ORAL_CAPSULE | Freq: Two times a day (BID) | ORAL | Status: DC
  Administered 2014-01-25 – 2014-01-28 (×7): 100 mg via ORAL
  Filled 2014-01-25 (×8): qty 1

## 2014-01-25 MED ORDER — HYDROMORPHONE HCL PF 1 MG/ML IJ SOLN
0.5000 mg | INTRAMUSCULAR | Status: DC | PRN
  Administered 2014-01-25 (×2): 0.5 mg via INTRAVENOUS
  Filled 2014-01-25 (×2): qty 1

## 2014-01-25 NOTE — Progress Notes (Signed)
Pt continued to complain of pain 10/10 after moving from chair to bed. I had admin .5 IV dilaudid at 1708, order was Q 4hrs.  I had also admin 20 mg of Oxy IR at 1530. The next dose of oxy was not due until 1930. Pt was yelling out loud in pain and Pts family members were out in the hall complaining of his severe uncontrolled pain. I spoke with Dr Lindie Spruce and received an order to change IV dilaudid to 1 MG and change frequency to Q 3 hrs. I admin 1 mg of IV dilaudid at 1850 and another dose of Oxy IR at 1845 after talking to Dr Lindie Spruce and receiving the order to admin meds at this time. Will continue to monitor Pts pain level and notify the night shift RN of Pts severe pain.

## 2014-01-25 NOTE — ED Notes (Signed)
Dr. Victorino Dike already in department when pt was brought into department, no ortho consult needed to be paged out.

## 2014-01-25 NOTE — Progress Notes (Signed)
Chaplain visited with aunt and patient. Chaplain made patient and family aware of spiritual care services. Patient asked for a Bible and chaplain delivered it. Will follow as needed.   01/25/14 1600  Clinical Encounter Type  Visited With Patient and family together  Visit Type Initial;Spiritual support  Referral From Nurse  Spiritual Encounters  Spiritual Needs Literature  Jiles Harold 01/25/2014 4:42 PM
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
## 2014-01-25 NOTE — Progress Notes (Signed)
Subjective:     Patient reports pain as severe.   Says he feels sore all over.  Especially his ribs.    Objective: Vital signs in last 24 hours: Temp:  [97.6 F (36.4 C)-100 F (37.8 C)] 100 F (37.8 C) (09/14 0541) Pulse Rate:  [70-107] 107 (09/14 0541) Resp:  [14-31] 18 (09/14 0541) BP: (92-154)/(50-95) 154/73 mmHg (09/14 0541) SpO2:  [92 %-100 %] 93 % (09/14 0541) Weight:  [90.357 kg (199 lb 3.2 oz)-90.719 kg (200 lb)] 90.357 kg (199 lb 3.2 oz) (09/14 0051)  Intake/Output from previous day: 09/13 0701 - 09/14 0700 In: 400 [I.V.:400] Out: 400 [Urine:400] Intake/Output this shift:     Recent Labs  01/24/14 2120 01/24/14 2134  HGB 15.9 16.7    Recent Labs  01/24/14 2120 01/24/14 2134  WBC 10.6*  --   RBC 4.70  --   HCT 43.6 49.0  PLT 313  --     Recent Labs  01/24/14 2120 01/24/14 2134  NA 140 140  K 3.7 3.3*  CL 100 103  CO2 19  --   BUN 10 8  CREATININE 0.96 1.20  GLUCOSE 116* 116*  CALCIUM 8.5  --     Recent Labs  01/24/14 2120  INR 1.09    PE:  wn wd male in mod distress due to pain.  C spine immobilized in hard collar.  L upper extremity and L LE without tenderness or deformity.  R forearm laceration dressed.  R ankle splinted.  wiggles toes.  brisk cap refill.  Assessment/Plan:     Up with therapy  nwb on right LE.  He'll need ORIF of the fibula and possibly the calc, but his soft tissues need to recover.  Likely next week for OR.  We'll continue to follow.  Toni Arthurs 01/25/2014, 7:48 AM
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
## 2014-01-25 NOTE — Progress Notes (Signed)
Orthopedic Tech Progress Note Patient Details:  [name] Sep 30, 1980 [id number]  Ortho Devices Type of Ortho Device: Velcro wrist forearm splint Ortho Device/Splint Location: rle Ortho Device/Splint Interventions: Application   Cammer, Mickie Bail 01/25/2014, 5:30 PM

## 2014-01-25 NOTE — Progress Notes (Signed)
UR completed.  Cobin Cadavid, RN BSN [facility] CCM Trauma/Neuro ICU Case Manager [phone]  
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
## 2014-01-26 DIAGNOSIS — I519 Heart disease, unspecified: Secondary | ICD-10-CM

## 2014-01-26 MED ORDER — LORAZEPAM 1 MG PO TABS
1.0000 mg | ORAL_TABLET | Freq: Four times a day (QID) | ORAL | Status: DC | PRN
Start: 2014-01-26 — End: 2014-01-28
  Administered 2014-01-26: 1 mg via ORAL
  Filled 2014-01-26: qty 1

## 2014-01-26 MED ORDER — FOLIC ACID 1 MG PO TABS
1.0000 mg | ORAL_TABLET | Freq: Every day | ORAL | Status: DC
  Administered 2014-01-26 – 2014-01-28 (×3): 1 mg via ORAL
  Filled 2014-01-26 (×4): qty 1

## 2014-01-26 MED ORDER — HYDROMORPHONE HCL PF 1 MG/ML IJ SOLN
0.5000 mg | INTRAMUSCULAR | Status: DC | PRN
  Administered 2014-01-28: 0.5 mg via INTRAVENOUS
  Filled 2014-01-26: qty 1

## 2014-01-26 MED ORDER — TRAMADOL HCL 50 MG PO TABS
100.0000 mg | ORAL_TABLET | Freq: Four times a day (QID) | ORAL | Status: DC
  Administered 2014-01-26 – 2014-01-28 (×9): 100 mg via ORAL
  Filled 2014-01-26 (×9): qty 2

## 2014-01-26 MED ORDER — ADULT MULTIVITAMIN W/MINERALS CH
1.0000 | ORAL_TABLET | Freq: Every day | ORAL | Status: DC
  Administered 2014-01-26 – 2014-01-28 (×3): 1 via ORAL
  Filled 2014-01-26 (×4): qty 1

## 2014-01-26 MED ORDER — THIAMINE HCL 100 MG/ML IJ SOLN
100.0000 mg | Freq: Every day | INTRAMUSCULAR | Status: DC
  Filled 2014-01-26 (×2): qty 1

## 2014-01-26 MED ORDER — VITAMIN B-1 100 MG PO TABS
100.0000 mg | ORAL_TABLET | Freq: Every day | ORAL | Status: DC
  Administered 2014-01-26 – 2014-01-28 (×3): 100 mg via ORAL
  Filled 2014-01-26 (×4): qty 1

## 2014-01-26 MED ORDER — LORAZEPAM 2 MG/ML IJ SOLN: 1.0000 mg | Freq: Four times a day (QID) | INTRAMUSCULAR | Status: DC | PRN
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
## 2014-01-26 NOTE — Progress Notes (Signed)
Rehab Admissions Coordinator Note:  Patient was screened by Clois Dupes for appropriateness for an Inpatient Acute Rehab Consult per PT recommendation. At this time, we are recommending Inpatient Rehab consult.  Clois Dupes 01/26/2014, 7:19 AM  I can be reached at [phone].

## 2014-01-26 NOTE — Progress Notes (Signed)
Patient ID: [name], male   DOB: Apr 24, 1981, 33 y.o.   MRN: [id number]   LOS: 2 days   Subjective: Not very communicative, moans with every breath. Will answer questions.   Objective: Vital signs in last 24 hours: Temp:  [98.6 F (37 C)-99.6 F (37.6 C)] 98.6 F (37 C) (09/15 0501) Pulse Rate:  [64-118] 88 (09/15 0501) Resp:  [16-18] 16 (09/15 0501) BP: (121-180)/(70-79) 145/70 mmHg (09/15 0501) SpO2:  [99 %-100 %] 100 % (09/15 0501) Last BM Date: 01/23/14   Physical Exam General appearance: alert and mild distress Resp: clear to auscultation bilaterally Cardio: regular rate and rhythm GI: normal findings: bowel sounds normal and soft, non-tender Skin: Left forearm laceration a bit macerated, soupy   Assessment/Plan: [facility]  BUE abrasions/lacs -- Local care, do not think delayed primary closure of forearm laceration is indicated at this time, will plan to leave to heal to secondary intention Right ankle/foot fxs -- Delayed ORIF planned by Dr. Victorino Dike, NWB  Acute alcohol intoxication -- ? If some of this behavior is withdrawal related. Will start CIWA. FEN -- Add scheduled tramadol VTE -- SCD's, Lovenox  Dispo -- PT/OT recommending CIR, will consult. Delayed ORIF may complicate things.    Freeman Caldron, PA-C Pager: [phone] General Trauma PA Pager: [phone]  01/26/2014

## 2014-01-26 NOTE — Progress Notes (Signed)
I will follow his progress with therapy as we await his surgical workup completion. [phone]
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
## 2014-01-26 NOTE — Progress Notes (Signed)
  Echocardiogram 2D Echocardiogram has been performed.  Cathie Beams 01/26/2014, 12:10 PM
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
## 2014-01-26 NOTE — Progress Notes (Signed)
Oriented. Admits to drinking every other day. CIWA protocol added. I also spoke with his aunt at the bedside. Patient examined and I agree with the assessment and plan  Violeta Gelinas, MD, MPH, FACS Trauma: [phone] General Surgery: [phone]  01/26/2014 10:12 AM

## 2014-01-26 NOTE — Progress Notes (Signed)
OT Cancellation Note  Patient Details Name: [name] MRN: [id number] DOB: 1980/08/09   Cancelled Treatment:    Reason Eval/Treat Not Completed: Patient at procedure or test/ unavailable Pt at x-ray. Acute OT to reattempt evaluation.  Pilar Grammes 01/26/2014, 12:07 PM

## 2014-01-27 MED ORDER — MORPHINE SULFATE ER 15 MG PO TBCR
15.0000 mg | EXTENDED_RELEASE_TABLET | Freq: Two times a day (BID) | ORAL | Status: DC
  Administered 2014-01-27 – 2014-01-28 (×3): 15 mg via ORAL
  Filled 2014-01-27 (×3): qty 1

## 2014-01-27 MED ORDER — WHITE PETROLATUM GEL
Status: AC
  Administered 2014-01-27: 12:00:00
  Filled 2014-01-27: qty 5
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
## 2014-01-27 NOTE — Progress Notes (Signed)
Patient planning on going home with his aunt who can provide 24/7 supervision.    This patient has been seen and I agree with the findings and treatment plan.  Marta Lamas. Gae Bon, MD, FACS [phone] (pager) [phone] (direct pager) Trauma Surgeon
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
## 2014-01-27 NOTE — Progress Notes (Signed)
Pt's Mom , [name], called me back to say that she feels she can arrange care for pt in her home after about a week of inpt rehab. One small step in back door and can drive car around to back of home for entry into home. I will discuss with Dr. Riley Kill with final determination and possible admit tomorrow if pt feels he will participate in intensity of therapy that is required. [phone]

## 2014-01-27 NOTE — Progress Notes (Signed)
Chaplain followed up with patient. Patient in better spirits today. Patient commented on how he is "lucky to be alive." Chaplain will follow as needed.  01/27/14 1300  Clinical Encounter Type  Visited With Patient  Visit Type Follow-up;Spiritual support  Spiritual Encounters  Spiritual Needs Emotional  Stress Factors  Patient Stress Factors Major life changes;Health changes;Family relationships  Arianie Couse, Loa Socks 01/27/2014 1:57 PM

## 2014-01-27 NOTE — Progress Notes (Signed)
OT Cancellation Note  Patient Details Name: [name] MRN: [id number] DOB: 18-Nov-1980   Cancelled Treatment:    Reason Eval/Treat Not Completed: Pain limiting ability to participate;Other (comment) (9/10 pain, pt reports having just been assisted back to bed. RN gave pain meds while therapist talking with patient) Will reattempt acute OT evaluation.   Pilar Grammes 01/27/2014, 1:46 PM

## 2014-01-27 NOTE — Discharge Instructions (Signed)
Toni Arthurs, MD [facility]  Please read the following information regarding your care  Medications  You only need a prescription for the narcotic pain medicine (ex. oxycodone, Percocet, Norco).  All of the other medicines listed below are available over the counter. X acetominophen (Tylenol) 650 mg every 4-6 hours as you need for minor pain X oxycodone as prescribed for moderate to severe pain    Narcotic pain medicine (ex. oxycodone, Percocet, Vicodin) will cause constipation.  To prevent this problem, take the following medicines while you are taking any pain medicine. X docusate sodium (Colace) 100 mg twice a day Xsenna (Senokot) 2 tablets twice a day  X To help prevent blood clots, take an aspirin (325 mg) once a day for a month after surgery.  You should also get up every hour while you are awake to move around.    Weight Bearing X Do not bear any weight on the operated leg or foot.  Cast / Splint / Dressing X Keep your splint or cast clean and dry.  Dont put anything (coat hanger, pencil, etc) down inside of it.  If it gets damp, use a hair dryer on the cool setting to dry it.  If it gets soaked, call the office to schedule an appointment for a cast change.     After your dressing, cast or splint is removed; you may shower, but do not soak or scrub the wound.  Allow the water to run over it, and then gently pat it dry.  Swelling It is normal for you to have swelling where you had surgery.  To reduce swelling and pain, keep your toes above your nose for at least 3 days after surgery.  It may be necessary to keep your foot or leg elevated for several weeks.  If it hurts, it should be elevated.  Follow Up Call my office at [phone] when you are discharged from the hospital to schedule an appointment to be seen in 1 week  Call my office at [phone] if you develop a fever >101.5 F, nausea, vomiting, bleeding from the surgical site or severe pain.

## 2014-01-27 NOTE — Progress Notes (Signed)
I met with pt at bedside to discuss his rehab venue options and caregiver support once d/c home. He also gave me permission to discuss with his Aunt [name] at bedside and also call his Mom, [name], at home. Pt lives with his Mom in a two story home with multiple step entry. Bed room is upstairs.Full bath downstairs.She runs an in home daycare. She is hesitant to say that pt can return home with her after a short inpt rehab stay due to the lay out of her home and his wt bearing restrictions. I discussed his other option of SNF rehab until he can functionally return home. Both pt and his Mom are reluctant for SNF but aware that we need to explore SNF unless the family can pull together dispo for a short possible one week inpt rehab stay. Mom to get back with me today. I will contact SW to begin SNF rehab search in case home dispo after a short rehab stay can be established. I will follow up with pt and his Mom today.I have discussed with Trauma PA. [phone]
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
## 2014-01-28 ENCOUNTER — Inpatient Hospital Stay (HOSPITAL_COMMUNITY)
Admission: RE | Admit: 2014-01-28 | Discharge: 2014-02-06 | DRG: 946 | Disposition: A | Discharge status: Home / Self Care | Payer: Self-pay | Source: Intra-hospital | Attending: Physical Medicine & Rehabilitation | Admitting: Physical Medicine & Rehabilitation

## 2014-01-28 ENCOUNTER — Encounter: Payer: Self-pay | Admitting: Gastroenterology

## 2014-01-28 DIAGNOSIS — S92001S Unspecified fracture of right calcaneus, sequela: Secondary | ICD-10-CM

## 2014-01-28 DIAGNOSIS — S92309A Fracture of unspecified metatarsal bone(s), unspecified foot, initial encounter for closed fracture: Secondary | ICD-10-CM | POA: Diagnosis present

## 2014-01-28 DIAGNOSIS — Z791 Long term (current) use of non-steroidal anti-inflammatories (NSAID): Secondary | ICD-10-CM

## 2014-01-28 DIAGNOSIS — Z5189 Encounter for other specified aftercare: Principal | ICD-10-CM

## 2014-01-28 DIAGNOSIS — K5900 Constipation, unspecified: Secondary | ICD-10-CM | POA: Diagnosis present

## 2014-01-28 DIAGNOSIS — J45909 Unspecified asthma, uncomplicated: Secondary | ICD-10-CM | POA: Diagnosis present

## 2014-01-28 DIAGNOSIS — F1010 Alcohol abuse, uncomplicated: Secondary | ICD-10-CM | POA: Diagnosis present

## 2014-01-28 DIAGNOSIS — T1490XA Injury, unspecified, initial encounter: Secondary | ICD-10-CM | POA: Diagnosis present

## 2014-01-28 DIAGNOSIS — S82109A Unspecified fracture of upper end of unspecified tibia, initial encounter for closed fracture: Secondary | ICD-10-CM

## 2014-01-28 DIAGNOSIS — S82409A Unspecified fracture of shaft of unspecified fibula, initial encounter for closed fracture: Secondary | ICD-10-CM | POA: Diagnosis present

## 2014-01-28 LAB — CBC
HCT: 35.8 % — ABNORMAL LOW (ref 39.0–52.0)
HEMOGLOBIN: 13 g/dL (ref 13.0–17.0)
MCH: 33.2 pg (ref 26.0–34.0)
MCHC: 36.3 g/dL — ABNORMAL HIGH (ref 30.0–36.0)
MCV: 91.3 fL (ref 78.0–100.0)
Platelets: 275 10*3/uL (ref 150–400)
RBC: 3.92 MIL/uL — AB (ref 4.22–5.81)
RDW: 11.6 % (ref 11.5–15.5)
WBC: 6 10*3/uL (ref 4.0–10.5)

## 2014-01-28 LAB — CREATININE, SERUM: Creatinine, Ser: 0.82 mg/dL (ref 0.50–1.35)

## 2014-01-28 MED ORDER — POLYETHYLENE GLYCOL 3350 17 G PO PACK
17.0000 g | PACK | Freq: Every day | ORAL | Status: DC
  Administered 2014-01-29 – 2014-02-05 (×8): 17 g via ORAL
  Filled 2014-01-28 (×10): qty 1

## 2014-01-28 MED ORDER — FOLIC ACID 1 MG PO TABS
1.0000 mg | ORAL_TABLET | Freq: Every day | ORAL | Status: DC
  Administered 2014-01-29 – 2014-02-05 (×8): 1 mg via ORAL
  Filled 2014-01-28 (×11): qty 1

## 2014-01-28 MED ORDER — ADULT MULTIVITAMIN W/MINERALS CH
1.0000 | ORAL_TABLET | Freq: Every day | ORAL | Status: DC
  Administered 2014-01-29 – 2014-02-05 (×8): 1 via ORAL
  Filled 2014-01-28 (×11): qty 1

## 2014-01-28 MED ORDER — ONDANSETRON HCL 4 MG PO TABS: 4.0000 mg | ORAL_TABLET | Freq: Four times a day (QID) | ORAL | Status: DC | PRN

## 2014-01-28 MED ORDER — DOCUSATE SODIUM 100 MG PO CAPS
100.0000 mg | ORAL_CAPSULE | Freq: Two times a day (BID) | ORAL | Status: DC
  Administered 2014-01-28 – 2014-02-06 (×18): 100 mg via ORAL
  Filled 2014-01-28 (×21): qty 1

## 2014-01-28 MED ORDER — ENOXAPARIN SODIUM 30 MG/0.3ML ~~LOC~~ SOLN
30.0000 mg | Freq: Two times a day (BID) | SUBCUTANEOUS | Status: DC
  Administered 2014-01-28 – 2014-02-06 (×18): 30 mg via SUBCUTANEOUS
  Filled 2014-01-28 (×21): qty 0.3

## 2014-01-28 MED ORDER — VITAMIN B-1 100 MG PO TABS
100.0000 mg | ORAL_TABLET | Freq: Every day | ORAL | Status: DC
  Administered 2014-01-29 – 2014-02-05 (×8): 100 mg via ORAL
  Filled 2014-01-28 (×10): qty 1

## 2014-01-28 MED ORDER — SORBITOL 70 % SOLN
30.0000 mL | Freq: Every day | Status: DC | PRN
  Administered 2014-01-29 – 2014-02-02 (×3): 30 mL via ORAL
  Filled 2014-01-28 (×3): qty 30

## 2014-01-28 MED ORDER — BACITRACIN ZINC 500 UNIT/GM EX OINT
1.0000 | TOPICAL_OINTMENT | Freq: Two times a day (BID) | CUTANEOUS | Status: DC
Start: 2014-01-28 — End: 2014-02-06
  Administered 2014-01-29 – 2014-02-06 (×15): 1 via TOPICAL
  Filled 2014-01-28 (×3): qty 28.35

## 2014-01-28 MED ORDER — ONDANSETRON HCL 4 MG/2ML IJ SOLN: 4.0000 mg | Freq: Four times a day (QID) | INTRAMUSCULAR | Status: DC | PRN

## 2014-01-28 MED ORDER — OXYCODONE HCL 5 MG PO TABS
10.0000 mg | ORAL_TABLET | ORAL | Status: DC | PRN
  Administered 2014-01-28 – 2014-02-04 (×21): 20 mg via ORAL
  Administered 2014-02-04: 10 mg via ORAL
  Administered 2014-02-04: 15 mg via ORAL
  Administered 2014-02-05: 10 mg via ORAL
  Administered 2014-02-05: 20 mg via ORAL
  Administered 2014-02-05: 10 mg via ORAL
  Administered 2014-02-06 (×2): 20 mg via ORAL
  Filled 2014-01-28 (×10): qty 4
  Filled 2014-01-28: qty 2
  Filled 2014-01-28 (×5): qty 4
  Filled 2014-01-28: qty 2
  Filled 2014-01-28: qty 4
  Filled 2014-01-28: qty 3
  Filled 2014-01-28 (×6): qty 4
  Filled 2014-01-28: qty 2
  Filled 2014-01-28 (×2): qty 4

## 2014-01-28 MED ORDER — MORPHINE SULFATE ER 15 MG PO TBCR
15.0000 mg | EXTENDED_RELEASE_TABLET | Freq: Two times a day (BID) | ORAL | Status: DC
  Administered 2014-01-28 – 2014-02-06 (×17): 15 mg via ORAL
  Filled 2014-01-28 (×18): qty 1

## 2014-01-28 MED ORDER — ACETAMINOPHEN 325 MG PO TABS
325.0000 mg | ORAL_TABLET | ORAL | Status: DC | PRN
  Filled 2014-01-28: qty 2

## 2014-01-28 MED ORDER — ENOXAPARIN SODIUM 30 MG/0.3ML ~~LOC~~ SOLN
30.0000 mg | Freq: Two times a day (BID) | SUBCUTANEOUS | Status: DC
  Filled 2014-01-28 (×2): qty 0.3
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
## 2014-01-28 NOTE — Progress Notes (Signed)
Discussed with Harvel Ricks, PA about pt wounds and dressing changes. Pt has had both BUE dressing changed today on prior unit form RN. Per Harvel Ricks, wounds will be assessed and dressings to be changed tomorrow morning. Dressings to BUE intact with splint to right wrist. Foam to left hip CDI. Soft cast/ace to RLE intact.
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
## 2014-01-28 NOTE — Progress Notes (Signed)
Patient arrived on unit at 1550.  Patient educated/oriented to rehab process and safety plan and has a verbal understanding.  Pt resting and call bell within reach.  Erenest Rasher, RN

## 2014-01-28 NOTE — Progress Notes (Signed)
Report called to inpt rehab. Spoke to [facility] RN who felt comfortable with the transfer and Pts plan of care. Pt going to 4 west room 9 at this time. Rayfield Citizen RN and Deania NT taking Pt in the bed per rehabs request.

## 2014-01-28 NOTE — Progress Notes (Signed)
To CIR today. Appreciate their help. Patient examined and I agree with the assessment and plan  Violeta Gelinas, MD, MPH, FACS Trauma: [phone] General Surgery: [phone]  01/28/2014 1:30 PM

## 2014-01-28 NOTE — Progress Notes (Signed)
Dressing changes done to bilateral upper extremities.  Right hand wounds and abrasions cleaned, ointment applied, and mepilex dressings applied, hand immobilizer applied.  Left hand wounds, abrasions cleaned, ointment applied gauze and kerlex applied with minimum discomfort. Ointment applied to hands and shoulder abrasions.

## 2014-01-28 NOTE — Progress Notes (Signed)
Pt and his Mom are in agreement to admission to inpt rehab today and bed is available. I will make the arrangements. RN CM aware as well as Trauma PA. [phone]

## 2014-01-28 NOTE — Discharge Summary (Signed)
Nicholas Willenbring, MD, MPH, FACS Trauma: [phone] General Surgery: [phone]  
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
## 2014-01-28 NOTE — Progress Notes (Signed)
Patient ID: [name], male   DOB: 12/06/1980, 33 y.o.   MRN: [id number]   LOS: 4 days   Subjective: NSC   Objective: Vital signs in last 24 hours: Temp:  [98 F (36.7 C)-99 F (37.2 C)] 98 F (36.7 C) (09/17 0606) Pulse Rate:  [61-66] 66 (09/17 0606) Resp:  [18] 18 (09/17 0606) BP: (136-143)/(68-73) 143/73 mmHg (09/17 0606) SpO2:  [98 %-100 %] 98 % (09/17 0606) Last BM Date: 01/23/14   Physical Exam General appearance: alert and no distress Resp: clear to auscultation bilaterally Cardio: regular rate and rhythm GI: normal findings: bowel sounds normal and soft, non-tender Extremities: NVI   Assessment/Plan: [facility]  BUE abrasions/lacs -- Local care  Right ankle/foot fxs -- Delayed ORIF planned by Dr. Victorino Dike, NWB  Acute alcohol intoxication -- Interaction improved today  FEN -- No issues VTE -- SCD's, Lovenox  Dispo -- PT/OT recommending CIR, they may admit. Delayed ORIF may complicate things.     Freeman Caldron, PA-C Pager: [phone] General Trauma PA Pager: [phone]  01/28/2014

## 2014-01-29 ENCOUNTER — Inpatient Hospital Stay (HOSPITAL_COMMUNITY): Payer: No Typology Code available for payment source | Admitting: Occupational Therapy

## 2014-01-29 ENCOUNTER — Inpatient Hospital Stay (HOSPITAL_COMMUNITY): Payer: No Typology Code available for payment source | Admitting: Physical Therapy

## 2014-01-29 DIAGNOSIS — M7989 Other specified soft tissue disorders: Secondary | ICD-10-CM

## 2014-01-29 LAB — CBC WITH DIFFERENTIAL/PLATELET
BASOS PCT: 0 % (ref 0–1)
Basophils Absolute: 0 10*3/uL (ref 0.0–0.1)
Eosinophils Absolute: 0.3 10*3/uL (ref 0.0–0.7)
Eosinophils Relative: 5 % (ref 0–5)
HCT: 36.3 % — ABNORMAL LOW (ref 39.0–52.0)
Hemoglobin: 13.2 g/dL (ref 13.0–17.0)
Lymphocytes Relative: 28 % (ref 12–46)
Lymphs Abs: 1.7 10*3/uL (ref 0.7–4.0)
MCH: 33.2 pg (ref 26.0–34.0)
MCHC: 36.4 g/dL — AB (ref 30.0–36.0)
MCV: 91.2 fL (ref 78.0–100.0)
MONO ABS: 1 10*3/uL (ref 0.1–1.0)
Monocytes Relative: 17 % — ABNORMAL HIGH (ref 3–12)
NEUTROS ABS: 2.9 10*3/uL (ref 1.7–7.7)
NEUTROS PCT: 50 % (ref 43–77)
Platelets: 320 10*3/uL (ref 150–400)
RBC: 3.98 MIL/uL — AB (ref 4.22–5.81)
RDW: 11.6 % (ref 11.5–15.5)
WBC: 5.9 10*3/uL (ref 4.0–10.5)

## 2014-01-29 LAB — COMPREHENSIVE METABOLIC PANEL
ALBUMIN: 3.2 g/dL — AB (ref 3.5–5.2)
ALK PHOS: 56 U/L (ref 39–117)
ALT: 37 U/L (ref 0–53)
AST: 56 U/L — ABNORMAL HIGH (ref 0–37)
Anion gap: 11 (ref 5–15)
BILIRUBIN TOTAL: 1 mg/dL (ref 0.3–1.2)
BUN: 8 mg/dL (ref 6–23)
CHLORIDE: 96 meq/L (ref 96–112)
CO2: 30 meq/L (ref 19–32)
Calcium: 9.1 mg/dL (ref 8.4–10.5)
Creatinine, Ser: 0.94 mg/dL (ref 0.50–1.35)
GFR calc Af Amer: >90 mL/min (ref >90)
Glucose, Bld: 95 mg/dL (ref 70–99)
POTASSIUM: 3.9 meq/L (ref 3.7–5.3)
Sodium: 137 mEq/L (ref 137–147)
Total Protein: 6.7 g/dL (ref 6.0–8.3)
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
## 2014-01-29 NOTE — Progress Notes (Signed)
Patient information reviewed and entered into eRehab System by Becky Suman Trivedi, covering PPS coordinator. Information including medical coding and functional independence measure will be reviewed and updated through discharge.   

## 2014-01-29 NOTE — Progress Notes (Signed)
Occupational Therapy Session Note  Patient Details  Name: [name] MRN: [id number] Date of Birth: 1980-09-26  Today's Date: 01/29/2014 OT Individual Time: 1345-1445 OT Individual Time Calculation (min): 60 min    Short Term Goals: Week 1:  OT Short Term Goal 1 (Week 1): STG = LTGs due to ELOS  Skilled Therapeutic Interventions/Progress Updates:    Engaged in treatment session with focus on functional transfers and education on safety with functional mobility.  Pt in bed upon arrival with visitors present, completed bed mobility with supervision and ambulated with PFRW approx 10 feet to w/c.  In ADL apt engaged in toilet transfers to/from w/c and ambulating with PFRW.  Pt required mod assist with toilet transfer, due to requiring lowering assist to toilet and then lifting from toilet.  Pt reports having low toilet at home, educated on [facility] over toilet to elevate toilet seat and provide handles to assist with transfers.  Pt also reports that bathroom may not even be accessible via RW, plan to have mother measure doorway.  Educated on use of BSC if need be for toileting due to non weight bearing status and need for PFRW for ambulation.  Pt required multiple rest breaks due to pain, reporting pain in ribs and Rt ankle.  Pt returned to bed at end of session via stand pivot with min assist, pt required mod assist to maintain standing while pulling pants off hips (when donning gown).  Therapy Documentation Precautions:  Precautions Precautions: Fall Restrictions Weight Bearing Restrictions: Yes RUE Weight Bearing: Non weight bearing RLE Weight Bearing: Non weight bearing General:   Vital Signs: Therapy Vitals Temp: 98.8 F (37.1 C) Temp src: Oral Pulse Rate: 82 Resp: 18 BP: 137/90 mmHg Patient Position (if appropriate): Lying Oxygen Therapy SpO2: 98 % O2 Device: None (Room air) Pain: Pain Assessment Pain Assessment: 0-10 Pain Score: 8  Pain Type: Acute pain Pain Descriptors  / Indicators: Grimacing;Discomfort Pain Frequency: Constant Pain Intervention(s): Medication (See eMAR) Multiple Pain Sites: Yes  See FIM for current functional status  Therapy/Group: Individual Therapy  Rosalio Loud 01/29/2014, 2:57 PM

## 2014-01-29 NOTE — Progress Notes (Signed)
Received a call from Vascular. They have an order for doppler to BLE. Advised vascular that pt has an ace wrap to RLE, and was not sure if MD would want this removed. Per vascula, please verify removal of wrapping, as this would be necessary.   Spoke to [employer], [place]. He would not recommend removal of ace wrap on RLE. Doppler to be done on LLE only.

## 2014-01-29 NOTE — Progress Notes (Signed)
VASCULAR LAB PRELIMINARY  PRELIMINARY  PRELIMINARY  PRELIMINARY  Bilateral lower extremity venous duplex completed.    Preliminary report:  Right:  No evidence of DVT form the groin the just below the knee. Unable to complete the lower leg due to bandages which the physician did not want to be remove. No evidence of superficial thrombosis, or Baker's cyst. Left:  No evidence of DVT, superficial thrombosis, or Baker's cyst.  [name], RVS 01/29/2014, 5:07 PM

## 2014-01-29 NOTE — Care Management Note (Signed)
[facility] Individual Statement of Services  Patient Name:  YOHANNES WAIBEL  Date:  01/29/2014  Welcome to the [facility].  Our goal is to provide you with an individualized program based on your diagnosis and situation, designed to meet your specific needs.  With this comprehensive rehabilitation program, you will be expected to participate in at least 3 hours of rehabilitation therapies Monday-Friday, with modified therapy programming on the weekends.  Your rehabilitation program will include the following services:  Physical Therapy (PT), Occupational Therapy (OT), 24 hour per day rehabilitation nursing, Therapeutic Recreaction (TR), Neuropsychology, Case Management (Social Worker), Rehabilitation Medicine, Nutrition Services and Pharmacy Services  Weekly team conferences will be held on Wednesday to discuss your progress.  Your Social Worker will talk with you frequently to get your input and to update you on team discussions.  Team conferences with you and your family in attendance may also be held.  Expected length of stay: 7-10 days  Overall anticipated outcome: mod/i level  Depending on your progress and recovery, your program may change. Your Social Worker will coordinate services and will keep you informed of any changes. Your Social Worker's name and contact numbers are listed  below.  The following services may also be recommended but are not provided by the [facility]:   Driving Evaluations  Home Health Rehabiltiation Services  Outpatient Rehabilitation Services  Vocational Rehabilitation   Arrangements will be made to provide these services after discharge if needed.  Arrangements include referral to agencies that provide these services.  Your insurance has been verified to be:  Self Pay Your primary doctor is:  None  Pertinent information will be shared with your doctor and your insurance company.  Social  Worker:  Dossie Der, SW [phone] or ([phone]  Information discussed with and copy given to patient by: [name], 01/29/2014, 1:25 PM
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
## 2014-01-30 ENCOUNTER — Inpatient Hospital Stay (HOSPITAL_COMMUNITY): Payer: No Typology Code available for payment source | Admitting: Occupational Therapy

## 2014-01-30 ENCOUNTER — Inpatient Hospital Stay (HOSPITAL_COMMUNITY): Payer: No Typology Code available for payment source | Admitting: Physical Therapy

## 2014-01-30 MED ORDER — LIDOCAINE 5 % EX PTCH
1.0000 | MEDICATED_PATCH | Freq: Once | CUTANEOUS | Status: AC
  Administered 2014-01-31: 1 via TRANSDERMAL
  Filled 2014-01-30 (×2): qty 1

## 2014-01-30 MED ORDER — LIDOCAINE 5 % EX PTCH
1.0000 | MEDICATED_PATCH | Freq: Once | CUTANEOUS | Status: AC
  Administered 2014-01-30: 1 via TRANSDERMAL
  Filled 2014-01-30: qty 1
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
## 2014-01-30 NOTE — Progress Notes (Signed)
Occupational Therapy Session Note  Patient Details  Name: [name] MRN: [id number] Date of Birth: 07/10/1980  Today's Date: 01/30/2014 OT Individual Time: 1100-1210 and 1310-1400 OT Individual Time Calculation (min): 70 min and 50 min   Short Term Goals: Week 1:  OT Short Term Goal 1 (Week 1): STG = LTGs due to ELOS  Skilled Therapeutic Interventions/Progress Updates:  Session 1 (1100-1210):Pt with 4/10 pain in R LE during session and reports, "I just took my pain meds." OT session with focus on STS, functional transfers, functional mobility, ADL training, and dynamic standing balance. STS x 6 reps from various surfaces of standard height with close supervision. OT encouraged pt to strap/unstrap self from platform walker. Pt ambulated with steady assist into bathroom for Min A shower transfer onto tub transfer bench. Pt performed bathing with assistance to wash L lower foot. OT educated pt on lateral leans on bench in order to wash buttocks. Pt demonstrated understanding with verbal cues. Dressing performed seated on EOB with set up to obtain clothing for UB and Mod A for LB dressing. Pt seated awaiting on arrival of lunch tray upon therapist exiting.   Session 2 (1310-1400): Upon entering the room, pt reporting pain as 2/10 in R LE. Pt seated on EOB upon entering the room.  Pt ambulated with platform walker to sink side and brushed teeth with steady assist while standing with platform walker.  Pt propelled wheelchair with L UE and utilized L LE to assist with guiding. Pt demonstrated the ability to back into elevator and operate buttons with verbal cues for technique. Upon returning to the floor, pt performed furniture transfers onto sofa and standard height bed with close supervision utilizing platform walker. Pt propelled self back to room and set up wheelchair for squat pivot to bed with verbal cues and steady assist for safety. Pt supine in bed with call bell and all needed items within  reach upon exiting the room.   Therapy Documentation Precautions:  Precautions Precautions: Fall Restrictions Weight Bearing Restrictions: Yes RUE Weight Bearing: Non weight bearing RLE Weight Bearing: Non weight bearing  See FIM for current functional status  Therapy/Group: Individual Therapy  Lowella Grip 01/30/2014, 1:01 PM

## 2014-01-30 NOTE — Progress Notes (Signed)
Physical Therapy Session Note  Patient Details  Name: [name] MRN: [id number] Date of Birth: [birth year]  Today's Date: 01/30/2014 PT Individual Time: 0901-1007 PT Individual Time Calculation (min): 66 min   Short Term Goals: Week 1:  PT Short Term Goal 1 (Week 1): Pt will perform all bed mobility with mod I with HOB flat. PT Short Term Goal 2 (Week 1): Pt will perform bed<>w/c transfer with min A and 25% cueing. PT Short Term Goal 3 (Week 1): Pt will perform sit<>stand from w/c with supervision. PT Short Term Goal 4 (Week 1): Pt will perform gait x150' with LRAD and supervision. PT Short Term Goal 5 (Week 1): Pt will negotiate single small step/threshold with LRAD and mod A to enable pt to enter home.  Skilled Therapeutic Interventions/Progress Updates:   Pt received in bed; pt reporting feeling very lethargic but willing to participate.  Pt transferred to EOB with min A.  Pt donned clothing EOB with assistance for pants donning over R foot but pt stood with min A and balanced on LLE to pull up pants.  Performed transfers bed > w/c <> mat and w/c > recliner at end of session with min A stand/squat pivot with LUE support on arm rests and good adherence to NWB RLE.  Performed w/c mobility in controlled environment with LUE and LLE with supervision; assistance for w/c parts management.  In w/c pt began to demonstrate increased RR, diaphoresis, reporting dizziness.  BP assessed and reported to MD.  Pt placed in supine and vitals re-assessed; MD notified of decrease in BP and felt elevated BP may be due to pain and valsalva maneuver.  Pt requesting to continue with session, MD verbally cleared pt to continue.  Performed RLE AAROM/strengthening with 8 reps each in supine: heel slides, hip ABD/ADD, hip IR/ER, SAQ with multiple rest breaks due to pain and to slow RR.  RN notified of increased pain.  Returned to sitting on mat and demonstrated to pt sequence for negotiation of one step with UE  support on PFRW hopping backwards up, forwards down step.  Pt gave repeat demonstration x 3 reps with min A and good clearance and adherance to NWB RLE.  Returned to w/c and to room where pt transferred to recliner to rest before B&D.     Therapy Documentation Precautions:  Precautions Precautions: Fall Restrictions Weight Bearing Restrictions: Yes RUE Weight Bearing: Non weight bearing RLE Weight Bearing: Non weight bearing Vital Signs: Therapy Vitals Patient Position (if appropriate): Orthostatic Vitals Oxygen Therapy SpO2: 100 % O2 Device: None (Room air) Pain: Pain Assessment Pain Assessment: 0-10 Pain Score: 2  Pain Type: Acute pain Pain Location: Generalized Pain Orientation: Right Pain Descriptors / Indicators: Throbbing Pain Onset: On-going Patients Stated Pain Goal: 2 Pain Intervention(s): Medication (See eMAR)  See FIM for current functional status  Therapy/Group: Individual Therapy  Edman Circle [facility] 01/30/2014, 12:25 PM
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
## 2014-01-30 NOTE — Plan of Care (Signed)
Problem: RH BOWEL ELIMINATION Goal: RH STG MANAGE BOWEL WITH ASSISTANCE STG Manage Bowel with min Assistance.  Outcome: Not Progressing Last BM on 01/23/14 received sorbitol last evening

## 2014-01-31 ENCOUNTER — Inpatient Hospital Stay (HOSPITAL_COMMUNITY): Payer: No Typology Code available for payment source | Admitting: Occupational Therapy

## 2014-01-31 NOTE — Progress Notes (Signed)
Occupational Therapy Session Note  Patient Details  Name: [name] MRN: [id number] Date of Birth: December 05, 1980  Today's Date: 01/31/2014 OT Individual Time: 0900-1002 OT Individual Time Calculation (min): 62 min    Short Term Goals: Week 1:  OT Short Term Goal 1 (Week 1): STG = LTGs due to ELOS  Skilled Therapeutic Interventions/Progress Updates:   Engaged in ADL showering, grooming, and dressing tasks within session, covering all dressings prior to showering.  Supervision level when seated at EOB <> standing transfers using platform walker, ambulating to shower with platform walker, and also with sit <> stand using shower bench due to NWB status and to increase safety during showering task. Min A required when standing to pull up boxers and shorts over buttocks and hips due to decreased standing balance while standing on Lt leg during dynamic LB dressing task.  Pt adhered to NWB status and no verbal cues were provided.  Noted wet dressings after engaging in showering task, RN notified and changed dressings.  Discussed with Pt in greater detail home environment layout (doorway widths when using platform walker), Pt reports plan to ask Mother about bathroom doorway and reports home having walk-in shower and small step over ledge of entry.  Therapy Documentation Precautions:  Precautions Precautions: Fall Restrictions Weight Bearing Restrictions: Yes RUE Weight Bearing: Non weight bearing RLE Weight Bearing: Non weight bearing General:   Vital Signs:   Pain: Pain Assessment Pain Score: 2   See FIM for current functional status  Therapy/Group: Individual Therapy  Caeli Linehan 01/31/2014, 12:24 PM
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
## 2014-01-31 NOTE — Progress Notes (Signed)
I reviewed and agree with the attached treatment note.  Rosalio Loud, OTR/L
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
## 2014-01-31 NOTE — Progress Notes (Signed)
Patient stated he did not want to take any medications for pain or fever (100.0). Patient educated on pain relief, and fever. adm

## 2014-01-31 NOTE — ED Provider Notes (Signed)
I saw and evaluated the patient, reviewed the resident's note and I agree with the findings and plan.   .Face to face Exam:  General:  Awake HEENT:  Atraumatic Resp:  Normal effort Abd:  Nondistended Neuro:No focal weakness  CRITICAL CARE Performed by: Nelva Nay L Total critical care time: 30 min Critical care time was exclusive of separately billable procedures and treating other patients. Critical care was necessary to treat or prevent imminent or life-threatening deterioration. Critical care was time spent personally by me on the following activities: development of treatment plan with patient and/or surrogate as well as nursing, discussions with consultants, evaluation of patient's response to treatment, examination of patient, obtaining history from patient or surrogate, ordering and performing treatments and interventions, ordering and review of laboratory studies, ordering and review of radiographic studies, pulse oximetry and re-evaluation of patient's condition.   Nelia Shi, MD 01/31/14 [phone]

## 2014-02-01 ENCOUNTER — Inpatient Hospital Stay (HOSPITAL_COMMUNITY): Payer: No Typology Code available for payment source | Admitting: Physical Therapy

## 2014-02-01 ENCOUNTER — Inpatient Hospital Stay (HOSPITAL_COMMUNITY): Payer: No Typology Code available for payment source | Admitting: Occupational Therapy

## 2014-02-01 NOTE — Plan of Care (Signed)
Problem: RH PAIN MANAGEMENT Goal: RH STG PAIN MANAGED AT OR BELOW PT'S PAIN GOAL <4  Outcome: Not Progressing Reports pain as 8     
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
## 2014-02-01 NOTE — Progress Notes (Signed)
I have reviewed and agree with the attached treatment note.  Tynisha Ogan, OTR/L 

## 2014-02-01 NOTE — Progress Notes (Signed)
Occupational Therapy Session Note  Patient Details  Name: [name] MRN: [id number] Date of Birth: 09-18-1980  Today's Date: 02/01/2014 OT Individual Time: 4098-1191 and 1300-1345 OT Individual Time Calculation (min): 45 min and 45 min   Short Term Goals: Week 1:  OT Short Term Goal 1 (Week 1): STG = LTGs due to ELOS  Skilled Therapeutic Interventions/Progress Updates:  1)  Pt c/o of increased overall pain level (9 of 10) upon arrival, RN made aware.  Transferred from EOB > platform walker and ambulated using platform walker to w/c positioned in front of sink prior to engaging in bathing, grooming, and dressing tasks at w/c level, all at supervision level.  Discussed benefits of bathing at sink serving as an energy conservation strategy when pain and fatigue level have increased.  Pt stood at sink at supervision level in order to pull underwear and shorts up over buttocks and hips, continuing to follow safety precautions pertaining to NWB during standing tasks.  Educated Pt on w/c positioning in order to brush teeth and do grooming tasks at sink while Rt leg is elevated in w/c foot rest.    2)  Pt reporting tightness and pain in Rt shoulder during shoulder flexion movements, therapist observed limited AROM in Rt shoulder at 90-100 degrees during shoulder flexion.  Encouraged Pt to report all specific areas of pain and concerns to physician to address issues.  Engaged in table-top activity consisting of reaching forward and superior in order to place clothes pins onto vertical pole using LUE, while standing on LLE, to increase standing endurance and balance while continuing to follow NWB precautions with RLE and Rt hand during dynamic standing task.  Required numerous rest breaks during activity in order for Pt to catch his breath, quick respiration rate noted at 36 per minute.   Therapy Documentation Precautions:  Precautions Precautions: Fall Restrictions Weight Bearing Restrictions:  Yes RUE Weight Bearing: Non weight bearing RLE Weight Bearing: Non weight bearing General:   Vital Signs:   Pain: Pain Assessment Pain Assessment: 0-10 Pain Score: 7  Pain Type: Acute pain;Chronic pain Pain Location: Rib cage Pain Orientation: Left Pain Descriptors / Indicators: Throbbing Pain Frequency: Intermittent Pain Onset: On-going Pain Intervention(s): RN made aware;Other (Comment) (RN already aware, gave medication pre-session) ADL:   See FIM for current functional status  Therapy/Group: Individual Therapy  Sheretta Grumbine 02/01/2014, 12:09 PM
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
## 2014-02-01 NOTE — Progress Notes (Signed)
Physical Therapy Session Note  Patient Details  Name: [name] MRN: [id number] Date of Birth: Mar 06, 1981  Today's Date: 02/01/2014 PT Individual Time: [phone] and [id number] PT Individual Time Calculation (min): 62 min and 30 min  Short Term Goals: Week 1:  PT Short Term Goal 1 (Week 1): Pt will perform all bed mobility with mod I with HOB flat. PT Short Term Goal 2 (Week 1): Pt will perform bed<>w/c transfer with min A and 25% cueing. PT Short Term Goal 3 (Week 1): Pt will perform sit<>stand from w/c with supervision. PT Short Term Goal 4 (Week 1): Pt will perform gait x150' with LRAD and supervision. PT Short Term Goal 5 (Week 1): Pt will negotiate single small step/threshold with LRAD and mod A to enable pt to enter home.  Skilled Therapeutic Interventions/Progress Updates:    Treatment Session 1: Pt received seated in w/c; agreeable to therapy. Session focused on step negotiation, car transfers, and w/c parts management. Pt performed w/c mobility x200' in controlled environment with L hemi technique and supervision. In ortho gym, this PT explained and demonstrated car transfer using PFRW while adhering to R hand/RLE NWB. Pt then gave effective return demonstration of stand pivot transfers x2 trials from w/c<>simulated car (SUV height) with PFRW and min guard and 25% cueing for adherence to RUE WB restrictions during initial trial, supervision for subsequent trial. Pt negotiated single curb step with PFRW ascending backwards, descending forwards with min A/steadying assist while pt moving walker, manual stabilization of walker. Session ended in pt room, where pt was left semi reclined in bed with all needs within reach.  Treatment Session 2: Pt received seated EOM in treatment gym having just completed OT session. Pt agreeable to therapy. Session focused on ambulation, w/c mobility in home environment. Pt performed w/c mobility x150' in controlled and home environment with L hemi  technique and supervision. Performed gait x35' in controlled environment with PFRW and min guard while adhering to WB restrictions. Gait trial ended secondary to onset of rapid breathing, diaphoresis. See below for detailed vital sign readings. Transported pt back to room in w/c with Total A. Pt left semi reclined in bed in no apparent distress with all needs within reach. Vitals (in semi reclined) WNL, with exception of RR of 30 and Oral Temperature of 99.1 degrees. RN notified of vitals, pt presentation.   Therapy Documentation Precautions:  Precautions Precautions: Fall Restrictions Weight Bearing Restrictions: Yes RUE Weight Bearing: Non weight bearing RLE Weight Bearing: Non weight bearing Vital Signs: 1401: Therapy Vitals Pulse Rate: 110 Resp: 32 BP: 172/98 mmHg Patient Position (if appropriate): Sitting Oxygen Therapy SpO2: 89 % (Increased to 100% within 10 seconds) O2 Device: None (Room air) 1417: Temp: 99.1 F  Temp src: Oral Pulse Rate: 89 Resp: 30 BP: 128/57 mmHg Patient Position (if appropriate): Lying Oxygen Therapy SpO2: 100 % O2 Device: None (Room air) Pain: Pain Assessment Pain Assessment: 0-10 Pain Score: 7  Pain Type: Acute pain;Chronic pain Pain Location: Rib cage Pain Orientation: Left Pain Descriptors / Indicators: Throbbing Pain Frequency: Intermittent Pain Onset: On-going Pain Intervention(s): RN made aware;Other (Comment) (RN already aware, gave medication pre-session)  See FIM for current functional status  Therapy/Group: Individual Therapy  Hobble, Lorenda Ishihara 02/01/2014, 9:15 AM

## 2014-02-02 ENCOUNTER — Inpatient Hospital Stay (HOSPITAL_COMMUNITY): Payer: No Typology Code available for payment source | Admitting: Occupational Therapy

## 2014-02-02 ENCOUNTER — Inpatient Hospital Stay (HOSPITAL_COMMUNITY): Payer: No Typology Code available for payment source | Admitting: Physical Therapy
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
## 2014-02-02 NOTE — Progress Notes (Signed)
Physical Therapy Session Note  Patient Details  Name: [name] MRN: [id number] Date of Birth: [birth year]  Today's Date: 02/02/2014 PT Individual Time: [phone] and 4782-9562 PT Individual Time Calculation (min): 61 min and 30 min  Short Term Goals: Week 1:  PT Short Term Goal 1 (Week 1): Pt will perform all bed mobility with mod I with HOB flat. PT Short Term Goal 2 (Week 1): Pt will perform bed<>w/c transfer with min A and 25% cueing. PT Short Term Goal 3 (Week 1): Pt will perform sit<>stand from w/c with supervision. PT Short Term Goal 4 (Week 1): Pt will perform gait x150' with LRAD and supervision. PT Short Term Goal 5 (Week 1): Pt will negotiate single small step/threshold with LRAD and mod A to enable pt to enter home.  Skilled Therapeutic Interventions/Progress Updates:    Treatment Session 1: Pt received seated in w/c; agreeable to therapy. Pt reporting increased pain in RLE; RN aware. Due to said pain, session focused on w/c mobility in community environment. Pt performed w/c mobility x250' in controlled and community environments with L hemi technique for adherence to weightbearing restrictions. Pt required supervision overall with exception of negotiation of door sills and changes in surface, both of which pt required min A due to pain in RLE. Pt negotiated standard ramp with min A to for controlled (forward) descent, min guard for (backwards) ascent. Recommended pt have mother bring in tennis shoes to increase pt safety/independence with w/c propulsion; pt verbally agreed. In hospital solarium, pt performed multiple squat pivot transfers from w/c<>furniture (varying surface heights) with supervision when transferring to L side, with close supervision when transferring to R side (secondary to RUE/LE WB restrictions). Pt set up all transfers and managed w/c parts with supervision, increased time. Session ended in pt room, where pt was left semi reclined in bed with all needs within  reach.  Treatment Session 2: Pt received seated in w/c; agreeable to therapy. Session focused on functional ambulation, transfers. Pt performed gait x129' in controlled environment with R PFRW and supervision while adhering to NWB R hand/RLE.  In treatment gym, pt performed graded sit<>stand transfers without UE support from progressively lower mat table height, supervision. This PT demonstrated sidestepping with PFRW while adhering to WB restrictions; explained reasons for sidestepping in home environment, such as entering narrow doorway. Pt gave effective return demonstration of sidestepping with PFRW requiring supervision. Session ended in pt room, where pt was left seated EOB with all needs within reach.  Therapy Documentation Precautions:  Precautions Precautions: Fall Restrictions Weight Bearing Restrictions: Yes RUE Weight Bearing: Non weight bearing RLE Weight Bearing: Non weight bearing Pain: Pain Assessment Pain Assessment: 0-10 Pain Score: 9  Pain Type: Acute pain Pain Location: Leg Pain Orientation: Right Pain Descriptors / Indicators: Throbbing Pain Onset: Gradual Pain Intervention(s): RN made aware;Elevated extremity Multiple Pain Sites: No Locomotion : Wheelchair Mobility Distance: 250   See FIM for current functional status  Therapy/Group: Individual Therapy  [name] 02/02/2014, 6:07 PM

## 2014-02-03 ENCOUNTER — Inpatient Hospital Stay (HOSPITAL_COMMUNITY): Payer: No Typology Code available for payment source | Admitting: Physical Therapy

## 2014-02-03 ENCOUNTER — Inpatient Hospital Stay (HOSPITAL_COMMUNITY): Payer: No Typology Code available for payment source | Admitting: Occupational Therapy

## 2014-02-03 NOTE — Progress Notes (Signed)
I have reviewed and agree with the attached treatment note.  Yitta Gongaware, OTR/L 
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
## 2014-02-03 NOTE — Progress Notes (Signed)
Physical Therapy Session Note  Patient Details  Name: [name] MRN: [id number] Date of Birth: March 02, 1981  Today's Date: 02/03/2014 PT Individual Time: [id number] and 3086-5784 PT Individual Time Calculation (min): 60 min and 25 min  Short Term Goals: Week 1:  PT Short Term Goal 1 (Week 1): Pt will perform all bed mobility with mod I with HOB flat. PT Short Term Goal 2 (Week 1): Pt will perform bed<>w/c transfer with min A and 25% cueing. PT Short Term Goal 3 (Week 1): Pt will perform sit<>stand from w/c with supervision. PT Short Term Goal 4 (Week 1): Pt will perform gait x150' with LRAD and supervision. PT Short Term Goal 5 (Week 1): Pt will negotiate single small step/threshold with LRAD and mod A to enable pt to enter home.  Skilled Therapeutic Interventions/Progress Updates:    Treatment Session 1: Pt received seated in w/c; agreeable to therapy. Sessio focused on increasing standing/activity tolerance and facilitating pt safety/independence with entering/exiting home. Pt performed functional ambulation x138' in controlled environment with R PFRW and supervision while adhering to NWB on R hand and RLE. In ortho gym, pt negotiated single curb step x3 trials total with R PFRW; backwards to ascend, forward to descend while adhering to WB restrictions. Pt required manual stabilization of walker for all trials and supervision for initial trial and final trial but mod A during second trial to recover from significant LOB to R side while moving walker from ground to curb step. Pt will require reinforcement of step negotiation. Performed stand pivot transfer from w/c<>simulated car (SUV height) with PFRW and supervision, cueing for safe hand placement when exiting car. After seated rest break, transitioned to gait x152' in controlled environment with R PFRW and supervision. Gait speed (self-selected): .27 m/s, suggesting increased fall risk. Session ended in pt room, where pt was left semi  reclined in bed with all needs within reach.  Treatment Session 2: Pt received seated in w/c; ready for therapy. Session focused on functional mobility in home environment. Pt performed gait x204' in home environment with R PFRW and supervision while adhering to WB restrictions; no overt LOB. Performed w/c mobility x250' in controlled and home environments with L hemi technique and mod I. Pt managed all w/c parts with out assistance or cueing. Session ended in pt room, where pt was left seated in w/c with all needs within reach.  Therapy Documentation Precautions:  Precautions Precautions: Fall Restrictions Weight Bearing Restrictions: Yes RUE Weight Bearing: Non weight bearing RLE Weight Bearing: Non weight bearing Vital Signs: Therapy Vitals Temp: 98.1 F (36.7 C) Temp src: Oral Pulse Rate: 56 Resp: 18 BP: 134/64 mmHg Patient Position (if appropriate): Lying Oxygen Therapy SpO2: 100 % Pain: Pain Assessment Pain Assessment: 0-10 Pain Score: 8  Pain Type: Acute pain Pain Location: Ankle Pain Orientation: Right Pain Descriptors / Indicators: Throbbing Pain Onset: On-going Pain Intervention(s): RN made aware;Other (Comment) (RN present to give medication pre-session) Multiple Pain Sites: Yes 2nd Pain Site Pain Score: 8 Pain Type: Acute pain Pain Location: Rib cage Pain Orientation: Left Pain Descriptors / Indicators: Aching;Sharp Pain Onset: On-going Pain Intervention(s): RN made aware;Other (Comment) (RN present to give meds pre-session) Locomotion : Ambulation Ambulation/Gait Assistance: 5: Supervision Wheelchair Mobility Distance: 200   See FIM for current functional status  Therapy/Group: Individual Therapy  Hobble, Lorenda Ishihara 02/03/2014, 12:51 PM
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
## 2014-02-03 NOTE — Progress Notes (Signed)
Social Work Patient ID: [name], male   DOB: 1980/06/27, 33 y.o.   MRN: [id number] Met with pt and spoke with Mom to discuss team conference goals-mod/i wheelchair and supervision with ambulation, with targted discharge date of 9/26. Discussed DME and follow up therapies, will try to have equipment delivered on Friday to Mom's home.  Will work on connecting pt to [employer] for medication assistance. Pt has applied for Medicaid unsure of disability.  Will try to find out this information

## 2014-02-04 ENCOUNTER — Inpatient Hospital Stay (HOSPITAL_COMMUNITY): Payer: No Typology Code available for payment source | Admitting: *Deleted

## 2014-02-04 ENCOUNTER — Inpatient Hospital Stay (HOSPITAL_COMMUNITY): Payer: No Typology Code available for payment source | Admitting: Occupational Therapy

## 2014-02-04 LAB — CREATININE, SERUM
CREATININE: 0.91 mg/dL (ref 0.50–1.35)
GFR calc Af Amer: >90 mL/min (ref >90)
GFR calc non Af Amer: >90 mL/min (ref >90)

## 2014-02-04 NOTE — Progress Notes (Signed)
I have reviewed and agree with the attached treatment note.  Gilma Bessette, OTR/L 

## 2014-02-04 NOTE — Progress Notes (Signed)
Physical Therapy Session Note  Patient Details  Name: [name] MRN: [id number] Date of Birth: [birth year]  Today's Date: 02/04/2014 PT Individual Time:  9:36-10:00 ( ) - Pt missed skilled PT due to pain; attempted rescheduling this morning, but unable  14:31-15:23 ( ) (able to make up from morning tx)   Short Term Goals: Week 1:  PT Short Term Goal 1 (Week 1): Pt will perform all bed mobility with mod I with HOB flat. PT Short Term Goal 2 (Week 1): Pt will perform bed<>w/c transfer with min A and 25% cueing. PT Short Term Goal 3 (Week 1): Pt will perform sit<>stand from w/c with supervision. PT Short Term Goal 4 (Week 1): Pt will perform gait x150' with LRAD and supervision. PT Short Term Goal 5 (Week 1): Pt will negotiate single small step/threshold with LRAD and mod A to enable pt to enter home.  Skilled Therapeutic Interventions/Progress Updates:  Pt initially refusing PT tx due to 10/10 pain, not received pain meds since last night. Unable to rearrange schedule but reattempted later and pt agreeable  Tx focsed on curb step practice and standing therex for strength and ROM.  Pt propelled WC x150' Mod I, indlucing parts management.  Pt performed blocked practice of curb step with min-guard A x4 with PFRW> PT demonstrated safe and slow technique, needing no cues.  Instructed pt in standing and seated therex including each of the following x10:  - RLE LAQ and Marching  - RLE hip ABD, hip EXT, hip flexion, HS curl in available range (min A), and marching Pt left up in [facility] with all needs in reach.   Second tx focused on community mobility and gait on grass to simulate possible home entry. Pt with no complaints of pain, agreeable to PT.  Pt just received new WC, and assisted in set-up and modification for comfort. Pt having difficulty finding comfortable resting position for RLE in new chair; attempted modification with towel and various component tightening. Will  likely need further modification. Instructed pt in parts management. Pt will likely have an easier time with foot propulsion with shoe on as new chair slightly higher.  Pt propeled WC in controlled and outdoor setting on Inclines and uneven surfaces >200' Mod I with safe technique. Mod I hemi-technique.    Gait training on outdoor uneven and grass with PFRW and S x100.' Pt able to safely perform lifting over uneven surfaces and ensure stable on grassy hill.   Performed various transfers WC<>WC sit<>stands and WC> bed, sit>supine all Mod I with safe and slow technique noted.  Pt making good gains towards DC this weekend. Pt with no further questions at this time.        Therapy Documentation Precautions:  Precautions Precautions: Fall Restrictions Weight Bearing Restrictions: Yes RUE Weight Bearing: Non weight bearing RLE Weight Bearing: Non weight bearing    Pain: Pain Assessment Pain Score: 9 - allowed rest and modified tx.   See FIM for current functional status  Therapy/Group: Individual Therapy Clydene Laming, PT, DPT   02/04/2014, 9:55 AM

## 2014-02-04 NOTE — Progress Notes (Signed)
Occupational Therapy Session Note  Patient Details  Name: [name] MRN: [id number] Date of Birth: Oct 30, 1980  Today's Date: 02/04/2014 OT Individual Time: [phone] and 1330-1416 OT Individual Time Calculation (min): 54 min and 46 min   Short Term Goals: Week 1:  OT Short Term Goal 1 (Week 1): STG = LTGs due to ELOS  Skilled Therapeutic Interventions/Progress Updates:   1)  Engaged in self-care tasks within room.  Supervision level with showering transfers using platform walker, Pt stepping backwards to touch shower chair ledge, using grab bars with sitting, and turning body into shower to complete showering task, Mod I to bath while utilizing lateral leans to wash buttocks while seated.  Supervision level with LB dressing when standing to pull up underwear and shorts over buttocks and hips due to current pain level.  Noted increased shakiness in Rt leg when standing and upon elevating in w/c foot rest.  Mod I for all other ADL tasks.  Educated on energy conservation and pain management strategies while participating in ADL tasks for increased safety awareness.  Pt reported pain level 10 of 10 at end of session, RN made aware, Pt awaiting for pain medication in w/c with Rt leg elevated with physician present.       2)  Focused on ADL simple meal prep goal in ADL apartment kitchen while ambulating with platform walker and also while seated in w/c.  Simulated meal prep task included problem-solving, gathering and putting away items at various locations, and maintaining safety awareness, in order to complete meal.  Discussed and recommended Pt use w/c in kitchen if home alone to increase safety due to decreased standing balance and NWB precautions.  Pt will discuss with Mother plans of moving microwave to more accessible location in kitchen.  Engaged in toilet transfers in ADL apartment bathroom using w/c and also with platform walker with side stepping technique to and from [facility] placed over  toilet, utilizing BSC grab bars to increase stability during transfers.  Pt expressed concerns of being able to access public restrooms safely using w/c upon d/c and would like to practice in next session.          Therapy Documentation Precautions:  Precautions Precautions: Fall Restrictions Weight Bearing Restrictions: Yes RUE Weight Bearing: Non weight bearing RLE Weight Bearing: Non weight bearing General: General PT Missed Treatment Reason: Pain Vital Signs:   Pain: Pain Assessment Pain Score: 9  ADL:       See FIM for current functional status  Therapy/Group: Individual Therapy  [name] 02/04/2014, 10:20 AM

## 2014-02-04 NOTE — Progress Notes (Signed)
I have reviewed and agree with the attached treatment note.  Magally Vahle, OTR/L 

## 2014-02-05 ENCOUNTER — Inpatient Hospital Stay (HOSPITAL_COMMUNITY): Payer: No Typology Code available for payment source | Admitting: Occupational Therapy

## 2014-02-05 ENCOUNTER — Inpatient Hospital Stay (HOSPITAL_COMMUNITY): Payer: No Typology Code available for payment source | Admitting: Physical Therapy

## 2014-02-05 ENCOUNTER — Inpatient Hospital Stay (HOSPITAL_COMMUNITY): Payer: No Typology Code available for payment source

## 2014-02-05 MED ORDER — ADULT MULTIVITAMIN W/MINERALS CH: 1.0000 | ORAL_TABLET | Freq: Every day | ORAL | Status: DC

## 2014-02-05 MED ORDER — MORPHINE SULFATE ER 15 MG PO TBCR: 15.0000 mg | EXTENDED_RELEASE_TABLET | Freq: Two times a day (BID) | ORAL | Status: DC

## 2014-02-05 MED ORDER — OXYCODONE HCL 10 MG PO TABS: 10.0000 mg | ORAL_TABLET | ORAL | Status: DC | PRN

## 2014-02-05 NOTE — Progress Notes (Signed)
Occupational Therapy Session Note  Patient Details  Name: [name] MRN: [id number] Date of Birth: [birth year]  Today's Date: 02/05/2014 OT Individual Time: 1330-1430 OT Individual Time Calculation (min): 60 min   Short Term Goals: Week 1:  OT Short Term Goal 1 (Week 1): STG = LTGs due to ELOS  Skilled Therapeutic Interventions/Progress Updates:  Patient sleeping in bed upon arrival. Engaged in functional mobility with platform walker, toileting and toilet transfer, use of BSC over commode to simulate home environment, reviewed that the hospital bed may have 2 long rails instead of 4 short rails like he is used to at the hospital therefore practiced bed mobility without use of bed rail.  Patient ambulated to bathroom with platform walker and Mod I with toileting tasks.  Engaged in RUE A/PROM exercises to within pain tolerance level.  Therapy Documentation Precautions:  Precautions Precautions: Fall Restrictions Weight Bearing Restrictions: Yes RUE Weight Bearing: Non weight bearing RLE Weight Bearing: Non weight bearing Pain: No report of pain See FIM for current functional status  Therapy/Group: Individual Therapy  Hyun Reali 02/05/2014, 4:15 PM
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
## 2014-02-05 NOTE — Discharge Summary (Signed)
Discharge summary job [phone]

## 2014-02-05 NOTE — Discharge Instructions (Signed)
Inpatient Rehab Discharge Instructions  [name] Discharge date and time: No discharge date for patient encounter.   Activities/Precautions/ Functional Status: Activity: Nonweightbearing right lower extremity Diet: regular diet Wound Care: keep wound clean and dry Functional status:  ___ No restrictions     ___ Walk up steps independently ___ 24/7 supervision/assistance   ___ Walk up steps with assistance ___ Intermittent supervision/assistance  ___ Bathe/dress independently ___ Walk with walker     ___ Bathe/dress with assistance ___ Walk Independently    ___ Shower independently _x__ Walk with assistance    ___ Shower with assistance ___ No alcohol     ___ Return to work/school ________  Special Instructions: Call Dr. Victorino Dike [phone] in regards to upcoming surgery right lower extremity   COMMUNITY REFERRALS UPON DISCHARGE:    Home Health:   PT, RN, SW   Agency:[facility] [phone] Date of last service:02/05/2014   Medical Equipment/Items Ordered:WHEELCHAIR, PLAT FOR ROLLING WALKER, BEDSIDE COMMODE, TUB BENCH, HOSPITAL BED  Agency/Supplier:[facility]   [phone] Other:[facility] MEDICAL FOLLOW UP MEDICAID AND SSD APPLICATIONS PENDING-MEDICAID QUESTIONS CINDY FINANCIAL COUNSELOR [id number]  My questions have been answered and I understand these instructions. I will adhere to these goals and the provided educational materials after my discharge from the hospital.  Patient/Caregiver Signature _______________________________ Date __________  Clinician Signature _______________________________________ Date __________  Please bring this form and your medication list with you to all your follow-up doctor's appointments.

## 2014-02-05 NOTE — Progress Notes (Signed)
Social Work Patient ID: [name], male   DOB: 03-01-1981, 33 y.o.   MRN: [id number] Met with pt to give him the Match letter for assistance with medicines.  Also gave him Community health and [facility] for medical follow up and medication assistance. His Mom to come in tomorrow for education prior to discharge.  Pt is not that happy with the wheelchair from [facility] but is the right size just different for the one here he has been using. Pt will have [place] and HHPT for a few visits to make sure transition going well at home.  Ready for discharge tomorrow.

## 2014-02-05 NOTE — Progress Notes (Signed)
Physical Therapy Session Note  Patient Details  Name: [name] MRN: [id number] Date of Birth: 1980/11/01  Today's Date: 02/05/2014 PT Individual Time: 0915-1016 PT Individual Time Calculation (min): 61 min   Short Term Goals: Week 1:  STG's=LTG's secondary to anticipate LOS.   Skilled Therapeutic Interventions/Progress Updates:    Pt received semi reclined in bed; agreeable to therapy. Session focused on functional ambulation, curb step negotiation. Pt reporting increased discomfort in lateral R ankle while sitting in personal w/c (recently received). Therefore, changed to w/c utilized during rehab stay; CSW notified of possible need for modification of personal w/c to address said discomfort. Pt performed w/c mobility x250' in controlled and home environment with L hemi technique and mod I. In ortho gym, pt negotiated single curb step with R PFRW with supervision while adhering to WB restrictions; no overt LOB; ascended backwards and descended forwards. Pt performed functional ambulation x230' in controlled and home environments with R PFRW and supervision while adhering to WB restrictions; no overt LOB. Session ended in pt room, where pt was left semi reclined in bed with all needs within reach.  Therapy Documentation Precautions:  Precautions Precautions: Fall Restrictions Weight Bearing Restrictions: Yes RUE Weight Bearing: Non weight bearing RLE Weight Bearing: Non weight bearing Vital Signs: Therapy Vitals Temp: 98.7 F (37.1 C) Temp src: Oral Pulse Rate: 59 Resp: 18 BP: 137/80 mmHg Patient Position (if appropriate): Lying Oxygen Therapy SpO2: 100 % O2 Device: None (Room air) Pain: Pain Assessment Pain Score: 3  Locomotion : Ambulation Ambulation/Gait Assistance: 5: Supervision Wheelchair Mobility Distance: 250   See FIM for current functional status  Therapy/Group: Individual Therapy  Hobble, Lorenda Ishihara 02/05/2014, 4:32 PM
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
## 2014-02-05 NOTE — Progress Notes (Signed)
Physical Therapy Discharge Summary  Patient Details  Name: [name] MRN: [id number] Date of Birth: 1980-09-23  Today's Date: 02/06/2014 PT Individual Time: 1003-1056 PT Individual Time Calculation (min): 53 min    Patient has met 11 of 11 long term goals due to improved activity tolerance, improved balance, improved postural control, increased strength and ability to compensate for deficits.  Patient to discharge at a wheelchair level Modified Independent and at ambulatory level with supervision. Patient's care partner is independent to provide the necessary physical assistance at discharge.  Reasons goals not met: N/A; all goals met.  Recommendation:  Patient will benefit from ongoing skilled PT services in home health setting to continue to advance safe functional mobility, address ongoing impairments in stability and independence with functional mobility, advance functional mobility as weightbearing restrictions are lifted, and minimize fall risk.  Equipment: w/c (18"x18") and cushion; elevating w/c leg rests; right platform rolling walker  Reasons for discharge: treatment goals met and discharge from hospital  Patient/family agrees with progress made and goals achieved: Yes  Skilled Therapeutic Interventions/Progress Updates Pt received semi reclined in bed accompanied by mother and other family members. Pt agreeable to therapy. Session focused on completion of hands-on family training with mother. Therapist explained, demonstrated safe supervision and appropriate cueing with all of the following aspects of functional mobility while adhering to NWB on R hand/wrist and NWB on RLE: negotiation of single threshold without rails using R PFRW, backwards to ascend and forward to descend with supervision-min guard and manual stabilization of rolling walker; stand pivot transfer from w/c<>simulated car with PFRW and supervision; supervision with gait x175' in controlled and home  environments with PFRW and supervision; sidestepping into bathroom with PFRW and supervision; shower transfer with PFRW and supervision. Family member gave effective return demonstration of safe supervision/assistance and appropriate cueing with all described aspects of functional mobility.  During family training, pt's mother raised concern that [place] may not fit completely into bathroom for shower transfer; specifically, mother concerned that pt may need to perform final 2 side steps to shower without walker due to close proximity of toilet. Therefore, this therapist explained, demonstrated how to safely provide min A, L HHA during L side steps x2; pt's mother gave effective return demonstration.  PT educated pt and mother on functional expectations upon discharge home. Emphasis given pt being mod I at w/c level and requiring supervision for all standing transfers and ambulation; pt/mother verbalized understanding and were in full agreement. Pt and mother verbalized feeling safe and comfortable performing/assisting with all aspects of functional mobility at discharge.  PT Discharge Precautions/Restrictions Precautions Precautions: Fall Restrictions Weight Bearing Restrictions: Yes RUE Weight Bearing: Non weight bearing (R hand/wrist) RLE Weight Bearing: Non weight bearing Pain Pain Assessment Pain Assessment: 0-10 Pain Score: 7  Pain Type: Acute pain Pain Location: Rib cage Pain Orientation: Left Pain Descriptors / Indicators: Sharp Pain Onset: With Activity Pain Intervention(s): Other (Comment) (Pt refused pain medication) Multiple Pain Sites: No Cognition Overall Cognitive Status: Within Functional Limits for tasks assessed Arousal/Alertness: Awake/alert Orientation Level: Oriented X4 Sensation Sensation Light Touch: Appears Intact Stereognosis: Appears Intact Hot/Cold: Appears Intact Proprioception: Not tested Additional Comments: Light touch not formally assessed in RLE  secondary to bandages. Pt denies numbness/tingling in RLE. Coordination Gross Motor Movements are Fluid and Coordinated: No Fine Motor Movements are Fluid and Coordinated: No (impaired on R) Finger Nose Finger Test: decreased speed Motor  Motor Motor: Within Functional Limits  Mobility Bed Mobility Bed Mobility: Supine to  Sit;Sit to Supine Supine to Sit: 6: Modified independent (Device/Increase time);HOB flat Sitting - Scoot to Edge of Bed: 6: Modified independent (Device/Increase time) Sit to Supine: 6: Modified independent (Device/Increase time);HOB flat Transfers Transfers: Yes Sit to Stand: 5: Supervision;With armrests;From bed;From chair/3-in-1 Stand to Sit: To chair/3-in-1;To bed;With armrests Stand Pivot Transfers: 5: Supervision;With armrests;Other (comment) (using R PFRW for adherence to WB restrictions) Squat Pivot Transfers: 6: Modified independent (Device/Increase time) Locomotion  Ambulation Ambulation/Gait Assistance: 5: Supervision Wheelchair Mobility Distance: 250  Trunk/Postural Assessment  Cervical Assessment Cervical Assessment: Within Functional Limits Thoracic Assessment Thoracic Assessment: Within Functional Limits Lumbar Assessment Lumbar Assessment: Within Functional Limits Postural Control Postural Control: Within Functional Limits  Balance Balance Balance Assessed: Yes Static Sitting Balance Static Sitting - Level of Assistance: 7: Independent Dynamic Sitting Balance Dynamic Sitting - Level of Assistance: 6: Modified independent (Device/Increase time) Static Standing Balance Static Standing - Level of Assistance: 6: Modified independent (Device/Increase time) Dynamic Standing Balance Dynamic Standing - Level of Assistance: 6: Modified independent (Device/Increase time) Extremity Assessment  RLE Assessment RLE Assessment: Exceptions to [facility] RLE Strength RLE Overall Strength: Due to pain;Other (Comment);Unable to assess (due to pain, wound  dressings, and WB restrictions)   See FIM for current functional status  Carrie Schoonmaker, Malva Cogan 02/06/2014, 6:26 PM

## 2014-02-05 NOTE — Progress Notes (Signed)
Social Work Discharge Note Discharge Note  The overall goal for the admission was met for:   Discharge location: Yes-HOME WITH MOM THERE IN THE EVENINGS  Length of Stay: Yes-9 DAYS  Discharge activity level: Yes-MOD/I LEVEL  Home/community participation: Yes  Services provided included: MD, RD, PT, OT, RN, CM, TR, Pharmacy and SW  Financial Services: Other: PENDING MEDICAID  Follow-up services arranged: Home Health: [facility]-PT,RN,SW, DME: [facility]-HOSPITAL BED, WHEELCHAIR, PLAT FORM ROLLING WALKER, BSC, TUB BENCH and Patient/Family has no preference for HH/DME agencies  Comments (or additional information):FAMILY EDUCATION WITH MOM SAT, PT MET GOALS.  INFORMATION GIVEN FOR [facility] APPT 9/29 AT 9;00.  MEDICAID AND SSD APPLICATIONS PENDING.    Patient/Family verbalized understanding of follow-up arrangements: Yes  Individual responsible for coordination of the follow-up plan: SELF & KAREN-MOM  Confirmed correct DME delivered: Elease Hashimoto 02/05/2014    Elease Hashimoto

## 2014-02-06 ENCOUNTER — Ambulatory Visit (HOSPITAL_COMMUNITY): Payer: Self-pay | Admitting: Physical Therapy

## 2014-02-06 DIAGNOSIS — S8290XS Unspecified fracture of unspecified lower leg, sequela: Secondary | ICD-10-CM

## 2014-02-06 DIAGNOSIS — T1490XA Injury, unspecified, initial encounter: Secondary | ICD-10-CM
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
## 2014-02-08 ENCOUNTER — Encounter (HOSPITAL_BASED_OUTPATIENT_CLINIC_OR_DEPARTMENT_OTHER): Payer: Self-pay | Admitting: *Deleted

## 2014-02-08 DIAGNOSIS — S82109A Unspecified fracture of upper end of unspecified tibia, initial encounter for closed fracture: Secondary | ICD-10-CM

## 2014-02-09 NOTE — Progress Notes (Signed)
Occupational Therapy Discharge Summary  Patient Details  Name: [name] MRN: [id number] Date of Birth: 01-29-1981  Today's Date: 02/09/2014 OT Individual Time:  -       Patient has met 10 of 10 long term goals due to improved activity tolerance, postural control, ability to compensate for deficits and improved awareness.  Patient to discharge at overall Modified Independent level.  Patient's care partner is independent to provide the necessary physical and cognitive assistance at discharge.  Pt plans to live with Mother at her home upon d/c.  Pt states Mother runs an in-home daycare service and will be able to provided assistance if needed at all times throughout the day and evening.  Recommend Pt to continue following RLE and Rt wrist NWB precautions.  Recommend Pt to utilize w/c for kitchen tasks when home alone, using platform walker in kitchen and bathroom when someone is in home and able to provide assistance if needed.       Reasons goals not met: N/A  Recommendation:  Patient will benefit from ongoing skilled OT services in home health setting to continue to advance functional skills in the area of BADL, iADL and Reduce care partner burden.  Equipment: tub transfer bench and 3 in 1 BSC  Reasons for discharge: treatment goals met and discharge from hospital  Patient/family agrees with progress made and goals achieved: Yes  OT Discharge Precautions/Restrictions  Precautions Precautions: Fall Restrictions Weight Bearing Restrictions: Yes RUE Weight Bearing: Non weight bearing RLE Weight Bearing: Non weight bearing ADL ADL Where Assessed-Grooming: Sitting at sink;Wheelchair Upper Body Bathing: Modified independent Where Assessed-Upper Body Bathing: Sitting at sink;Wheelchair;Shower Lower Body Bathing: Modified independent Where Assessed-Lower Body Bathing: Sitting at sink;Wheelchair;Shower;Standing at sink Upper Body Dressing: Modified independent (Device) Where  Assessed-Upper Body Dressing: Edge of bed;Sitting at sink;Wheelchair Lower Body Dressing: Modified independent Where Assessed-Lower Body Dressing: Sitting at sink;Standing at sink;Edge of bed;Wheelchair Toileting: Modified independent Where Assessed-Toileting: Toilet;Bedside Commode (BSC over toilet) Toilet Transfer: Modified independent Toilet Transfer Method: Counselling psychologist: Grab bars;Bedside commode Social research officer, government: Modified independent Social research officer, government Method: Heritage manager: Civil engineer, contracting with back;Transfer tub bench;Grab bars Vision/Perception  Vision- History Baseline Vision/History: No visual deficits Patient Visual Report: No change from baseline  Cognition Overall Cognitive Status: Within Functional Limits for tasks assessed Arousal/Alertness: Awake/alert Orientation Level: Oriented X4 Attention: Focused Memory: Appears intact Problem Solving: Appears intact Safety/Judgment: Appears intact Sensation   Motor  Motor Motor: Within Functional Limits Motor - Skilled Clinical Observations: limited strength, muscular endurance in RLE Mobility  Bed Mobility Bed Mobility: Supine to Sit;Sit to Supine;Sitting - Scoot to Edge of Bed Supine to Sit: 6: Modified independent (Device/Increase time) Sitting - Scoot to Edge of Bed: 6: Modified independent (Device/Increase time) Sit to Supine: 6: Modified independent (Device/Increase time);HOB flat Transfers Transfers: Sit to Stand;Stand to Sit Sit to Stand: 6: Modified independent (Device/Increase time) Stand to Sit: 6: Modified independent (Device/Increase time)  Trunk/Postural Assessment     Balance Static Sitting Balance Static Sitting - Balance Support: Feet supported Static Sitting - Level of Assistance: 7: Independent Dynamic Sitting Balance Dynamic Sitting - Balance Support: Feet supported Dynamic Sitting - Level of Assistance: 6: Modified independent  (Device/Increase time) Dynamic Sitting - Balance Activities: Forward lean/weight shifting;Reaching for objects;Reaching across midline;Trunk control activities Static Standing Balance Static Standing - Balance Support: No upper extremity supported Static Standing - Level of Assistance: 6: Modified independent (Device/Increase time) Dynamic Standing Balance Dynamic Standing - Level of Assistance:  6: Modified independent (Device/Increase time) Extremity/Trunk Assessment RUE Assessment RUE Assessment: Exceptions to [facility] RUE AROM (degrees) RUE Overall AROM Comments: limited shoulder flexion at 100 degrees due to possible rotator cuff injury RUE Strength RUE Overall Strength Comments: strength grossly 5/5 in elbow, 3/5 in shoulder and wrist not assessed LUE Assessment LUE Assessment: Within Functional Limits  See FIM for current functional status  Lamm,Jillana 02/09/2014, 7:07 AM

## 2014-02-10 ENCOUNTER — Other Ambulatory Visit: Payer: Self-pay | Admitting: Physician Assistant

## 2014-02-10 ENCOUNTER — Telehealth: Payer: Self-pay | Admitting: *Deleted

## 2014-02-10 NOTE — Telephone Encounter (Signed)
[name] Called stating [name] has an appt with Dr Wynn BankerKirsteins tomorrow and he is requesting some lidoderm patches for his rib pain, the morphine/oxy is not touching this pain.  I left a message for [name] that he does not have an appt with Dr Wynn BankerKirsteins , there is no appt scheduled to see Dr Wynn BankerKirsteins in the office, and Dr Wynn BankerKirsteins is out of the office until Monday.  I will send a message to [name] PA in the hospital.

## 2014-02-11 ENCOUNTER — Encounter (HOSPITAL_BASED_OUTPATIENT_CLINIC_OR_DEPARTMENT_OTHER): Payer: Self-pay | Admitting: Certified Registered"

## 2014-02-11 ENCOUNTER — Ambulatory Visit (HOSPITAL_BASED_OUTPATIENT_CLINIC_OR_DEPARTMENT_OTHER): Payer: Self-pay | Admitting: Certified Registered"

## 2014-02-11 ENCOUNTER — Encounter (HOSPITAL_BASED_OUTPATIENT_CLINIC_OR_DEPARTMENT_OTHER): Payer: Self-pay

## 2014-02-11 ENCOUNTER — Ambulatory Visit (HOSPITAL_BASED_OUTPATIENT_CLINIC_OR_DEPARTMENT_OTHER)
Admission: RE | Admit: 2014-02-11 | Discharge: 2014-02-11 | Disposition: A | Discharge status: Home / Self Care | Payer: Self-pay | Source: Ambulatory Visit | Attending: Orthopedic Surgery | Admitting: Orthopedic Surgery

## 2014-02-11 ENCOUNTER — Encounter (HOSPITAL_BASED_OUTPATIENT_CLINIC_OR_DEPARTMENT_OTHER)
Admission: RE | Disposition: A | Discharge status: Home / Self Care | Payer: Self-pay | Source: Ambulatory Visit | Attending: Orthopedic Surgery

## 2014-02-11 DIAGNOSIS — S92211A Displaced fracture of cuboid bone of right foot, initial encounter for closed fracture: Secondary | ICD-10-CM | POA: Insufficient documentation

## 2014-02-11 DIAGNOSIS — S92241A Displaced fracture of medial cuneiform of right foot, initial encounter for closed fracture: Secondary | ICD-10-CM | POA: Insufficient documentation

## 2014-02-11 DIAGNOSIS — Y998 Other external cause status: Secondary | ICD-10-CM | POA: Insufficient documentation

## 2014-02-11 DIAGNOSIS — Z720 Tobacco use: Secondary | ICD-10-CM | POA: Insufficient documentation

## 2014-02-11 DIAGNOSIS — S92001A Unspecified fracture of right calcaneus, initial encounter for closed fracture: Secondary | ICD-10-CM | POA: Insufficient documentation

## 2014-02-11 DIAGNOSIS — M25871 Other specified joint disorders, right ankle and foot: Secondary | ICD-10-CM | POA: Insufficient documentation

## 2014-02-11 DIAGNOSIS — S8261XA Displaced fracture of lateral malleolus of right fibula, initial encounter for closed fracture: Secondary | ICD-10-CM | POA: Insufficient documentation

## 2014-02-11 DIAGNOSIS — S82891D Other fracture of right lower leg, subsequent encounter for closed fracture with routine healing: Secondary | ICD-10-CM

## 2014-02-11 DIAGNOSIS — Y9389 Activity, other specified: Secondary | ICD-10-CM | POA: Insufficient documentation

## 2014-02-11 DIAGNOSIS — S92251A Displaced fracture of navicular [scaphoid] of right foot, initial encounter for closed fracture: Secondary | ICD-10-CM | POA: Insufficient documentation

## 2014-02-11 DIAGNOSIS — Y92488 Other paved roadways as the place of occurrence of the external cause: Secondary | ICD-10-CM | POA: Insufficient documentation

## 2014-02-11 HISTORY — DX: Pleurodynia: R07.81

## 2014-02-11 HISTORY — DX: Unspecified multiple injuries, initial encounter: T07.XXXA

## 2014-02-11 HISTORY — DX: Other fracture of right lower leg, initial encounter for closed fracture: S82.891A

## 2014-02-11 HISTORY — PX: ORIF ANKLE FRACTURE: SHX5408

## 2014-02-11 HISTORY — DX: Personal history of other diseases of the respiratory system: Z87.09

## 2014-02-11 LAB — POCT HEMOGLOBIN-HEMACUE: Hemoglobin: 15.6 g/dL (ref 13.0–17.0)

## 2014-02-11 SURGERY — OPEN REDUCTION INTERNAL FIXATION (ORIF) ANKLE FRACTURE
Anesthesia: General | Site: Foot | Laterality: Right

## 2014-02-11 MED ORDER — PROPOFOL 10 MG/ML IV BOLUS
INTRAVENOUS | Status: DC | PRN
  Administered 2014-02-11: 150 mg via INTRAVENOUS

## 2014-02-11 MED ORDER — MIDAZOLAM HCL 2 MG/2ML IJ SOLN
1.0000 mg | INTRAMUSCULAR | Status: DC | PRN
  Administered 2014-02-11: 2 mg via INTRAVENOUS

## 2014-02-11 MED ORDER — HYDROMORPHONE HCL 1 MG/ML IJ SOLN
0.2500 mg | INTRAMUSCULAR | Status: DC | PRN
  Administered 2014-02-11: 0.5 mg via INTRAVENOUS
  Administered 2014-02-11: 0.25 mg via INTRAVENOUS

## 2014-02-11 MED ORDER — 0.9 % SODIUM CHLORIDE (POUR BTL) OPTIME
TOPICAL | Status: DC | PRN
  Administered 2014-02-11: 200 mL

## 2014-02-11 MED ORDER — LIDOCAINE HCL (CARDIAC) 20 MG/ML IV SOLN
INTRAVENOUS | Status: DC | PRN
  Administered 2014-02-11: 30 mg via INTRAVENOUS

## 2014-02-11 MED ORDER — MIDAZOLAM HCL 2 MG/ML PO SYRP
12.0000 mg | ORAL_SOLUTION | Freq: Once | ORAL | Status: DC | PRN
Start: 2014-02-11 — End: 2014-02-11

## 2014-02-11 MED ORDER — FENTANYL CITRATE 0.05 MG/ML IJ SOLN
INTRAMUSCULAR | Status: DC | PRN
  Administered 2014-02-11 (×2): 25 ug via INTRAVENOUS
  Administered 2014-02-11 (×2): 50 ug via INTRAVENOUS
  Administered 2014-02-11: 25 ug via INTRAVENOUS
  Administered 2014-02-11: 50 ug via INTRAVENOUS
  Administered 2014-02-11: 25 ug via INTRAVENOUS

## 2014-02-11 MED ORDER — ACETAMINOPHEN 500 MG PO TABS
ORAL_TABLET | ORAL | Status: AC
  Filled 2014-02-11: qty 2

## 2014-02-11 MED ORDER — ACETAMINOPHEN 500 MG PO TABS
1000.0000 mg | ORAL_TABLET | Freq: Once | ORAL | Status: AC
  Administered 2014-02-11: 1000 mg via ORAL

## 2014-02-11 MED ORDER — OXYCODONE HCL 5 MG/5ML PO SOLN: 5.0000 mg | Freq: Once | ORAL | Status: DC | PRN

## 2014-02-11 MED ORDER — BACITRACIN ZINC 500 UNIT/GM EX OINT
TOPICAL_OINTMENT | CUTANEOUS | Status: AC
  Filled 2014-02-11: qty 28.35

## 2014-02-11 MED ORDER — BUPIVACAINE-EPINEPHRINE (PF) 0.5% -1:200000 IJ SOLN
INTRAMUSCULAR | Status: AC
  Filled 2014-02-11: qty 30

## 2014-02-11 MED ORDER — FENTANYL CITRATE 0.05 MG/ML IJ SOLN
INTRAMUSCULAR | Status: AC
  Filled 2014-02-11: qty 2

## 2014-02-11 MED ORDER — ONDANSETRON HCL 4 MG/2ML IJ SOLN
INTRAMUSCULAR | Status: DC | PRN
  Administered 2014-02-11: 4 mg via INTRAVENOUS

## 2014-02-11 MED ORDER — CEFAZOLIN SODIUM-DEXTROSE 2-3 GM-% IV SOLR
2.0000 g | INTRAVENOUS | Status: AC
  Administered 2014-02-11: 2 g via INTRAVENOUS

## 2014-02-11 MED ORDER — LACTATED RINGERS IV SOLN
INTRAVENOUS | Status: DC
  Administered 2014-02-11 (×3): via INTRAVENOUS

## 2014-02-11 MED ORDER — CHLORHEXIDINE GLUCONATE 4 % EX LIQD: 60.0000 mL | Freq: Once | CUTANEOUS | Status: DC

## 2014-02-11 MED ORDER — MIDAZOLAM HCL 2 MG/2ML IJ SOLN
INTRAMUSCULAR | Status: AC
  Filled 2014-02-11: qty 2

## 2014-02-11 MED ORDER — BACITRACIN ZINC 500 UNIT/GM EX OINT
TOPICAL_OINTMENT | CUTANEOUS | Status: DC | PRN
  Administered 2014-02-11: 1 via TOPICAL

## 2014-02-11 MED ORDER — DEXAMETHASONE SODIUM PHOSPHATE 10 MG/ML IJ SOLN
INTRAMUSCULAR | Status: DC | PRN
  Administered 2014-02-11: 10 mg via INTRAVENOUS

## 2014-02-11 MED ORDER — BUPIVACAINE-EPINEPHRINE (PF) 0.5% -1:200000 IJ SOLN
INTRAMUSCULAR | Status: DC | PRN
  Administered 2014-02-11: 30 mL via PERINEURAL

## 2014-02-11 MED ORDER — OXYCODONE HCL 5 MG PO TABS: 5.0000 mg | ORAL_TABLET | Freq: Once | ORAL | Status: DC | PRN

## 2014-02-11 MED ORDER — FENTANYL CITRATE 0.05 MG/ML IJ SOLN
INTRAMUSCULAR | Status: AC
  Filled 2014-02-11: qty 6

## 2014-02-11 MED ORDER — BUPIVACAINE-EPINEPHRINE 0.5% -1:200000 IJ SOLN
INTRAMUSCULAR | Status: DC | PRN
  Administered 2014-02-11: 10 mL

## 2014-02-11 MED ORDER — OXYCODONE HCL 5 MG PO TABS: 5.0000 mg | ORAL_TABLET | ORAL | Status: DC | PRN

## 2014-02-11 MED ORDER — SODIUM CHLORIDE 0.9 % IV SOLN: INTRAVENOUS | Status: DC

## 2014-02-11 MED ORDER — CEFAZOLIN SODIUM-DEXTROSE 2-3 GM-% IV SOLR
INTRAVENOUS | Status: AC
  Filled 2014-02-11: qty 50

## 2014-02-11 MED ORDER — HYDROMORPHONE HCL 1 MG/ML IJ SOLN
INTRAMUSCULAR | Status: AC
  Filled 2014-02-11: qty 1

## 2014-02-11 MED ORDER — FENTANYL CITRATE 0.05 MG/ML IJ SOLN
50.0000 ug | INTRAMUSCULAR | Status: DC | PRN
  Administered 2014-02-11: 100 ug via INTRAVENOUS

## 2014-02-11 MED ORDER — MIDAZOLAM HCL 5 MG/5ML IJ SOLN
INTRAMUSCULAR | Status: DC | PRN
  Administered 2014-02-11: 2 mg via INTRAVENOUS

## 2014-02-11 MED ORDER — PROMETHAZINE HCL 25 MG/ML IJ SOLN: 6.2500 mg | INTRAMUSCULAR | Status: DC | PRN

## 2014-02-11 SURGICAL SUPPLY — 78 items
BANDAGE ESMARK 6X9 LF (GAUZE/BANDAGES/DRESSINGS) ×1 IMPLANT
BIT DRILL 2.5X2.75 QC CALB (BIT) ×3 IMPLANT
BLADE SURG 15 STRL LF DISP TIS (BLADE) ×2 IMPLANT
BLADE SURG 15 STRL SS (BLADE) ×4
BNDG COHESIVE 4X5 TAN STRL (GAUZE/BANDAGES/DRESSINGS) ×3 IMPLANT
BNDG COHESIVE 6X5 TAN STRL LF (GAUZE/BANDAGES/DRESSINGS) ×3 IMPLANT
BNDG ESMARK 4X9 LF (GAUZE/BANDAGES/DRESSINGS) IMPLANT
BNDG ESMARK 6X9 LF (GAUZE/BANDAGES/DRESSINGS) ×3
CANISTER SUCT 1200ML W/VALVE (MISCELLANEOUS) ×3 IMPLANT
CHLORAPREP W/TINT 26ML (MISCELLANEOUS) ×3 IMPLANT
COVER TABLE BACK 60X90 (DRAPES) ×3 IMPLANT
CUFF TOURNIQUET SINGLE 34IN LL (TOURNIQUET CUFF) ×3 IMPLANT
DECANTER SPIKE VIAL GLASS SM (MISCELLANEOUS) IMPLANT
DRAPE C-ARM 42X72 X-RAY (DRAPES) IMPLANT
DRAPE C-ARMOR (DRAPES) IMPLANT
DRAPE EXTREMITY TIBURON (DRAPES) ×3 IMPLANT
DRAPE OEC MINIVIEW 54X84 (DRAPES) ×3 IMPLANT
DRAPE U-SHAPE 47X51 STRL (DRAPES) ×3 IMPLANT
DRSG ADAPTIC 3X8 NADH LF (GAUZE/BANDAGES/DRESSINGS) ×3 IMPLANT
DRSG EMULSION OIL 3X3 NADH (GAUZE/BANDAGES/DRESSINGS) IMPLANT
DRSG PAD ABDOMINAL 8X10 ST (GAUZE/BANDAGES/DRESSINGS) ×6 IMPLANT
ELECT REM PT RETURN 9FT ADLT (ELECTROSURGICAL) ×3
ELECTRODE REM PT RTRN 9FT ADLT (ELECTROSURGICAL) ×1 IMPLANT
GAUZE SPONGE 4X4 12PLY STRL (GAUZE/BANDAGES/DRESSINGS) ×3 IMPLANT
GLOVE BIO SURGEON STRL SZ7 (GLOVE) IMPLANT
GLOVE BIO SURGEON STRL SZ8 (GLOVE) ×3 IMPLANT
GLOVE BIOGEL PI IND STRL 7.0 (GLOVE) ×1 IMPLANT
GLOVE BIOGEL PI IND STRL 8 (GLOVE) ×1 IMPLANT
GLOVE BIOGEL PI INDICATOR 7.0 (GLOVE) ×2
GLOVE BIOGEL PI INDICATOR 8 (GLOVE) ×2
GLOVE ECLIPSE 6.5 STRL STRAW (GLOVE) ×3 IMPLANT
GLOVE EXAM NITRILE MD LF STRL (GLOVE) ×3 IMPLANT
GOWN STRL REUS W/ TWL LRG LVL3 (GOWN DISPOSABLE) ×1 IMPLANT
GOWN STRL REUS W/ TWL XL LVL3 (GOWN DISPOSABLE) ×1 IMPLANT
GOWN STRL REUS W/TWL LRG LVL3 (GOWN DISPOSABLE) ×2
GOWN STRL REUS W/TWL XL LVL3 (GOWN DISPOSABLE) ×2
K-WIRE ACE 1.6X6 (WIRE) ×6
KWIRE ACE 1.6X6 (WIRE) ×2 IMPLANT
NEEDLE HYPO 22GX1.5 SAFETY (NEEDLE) ×3 IMPLANT
NS IRRIG 1000ML POUR BTL (IV SOLUTION) ×3 IMPLANT
PACK BASIN DAY SURGERY FS (CUSTOM PROCEDURE TRAY) ×3 IMPLANT
PAD CAST 4YDX4 CTTN HI CHSV (CAST SUPPLIES) ×1 IMPLANT
PADDING CAST ABS 4INX4YD NS (CAST SUPPLIES) ×2
PADDING CAST ABS COTTON 4X4 ST (CAST SUPPLIES) ×1 IMPLANT
PADDING CAST COTTON 4X4 STRL (CAST SUPPLIES) ×2
PADDING CAST COTTON 6X4 STRL (CAST SUPPLIES) ×3 IMPLANT
PENCIL BUTTON HOLSTER BLD 10FT (ELECTRODE) ×3 IMPLANT
PLATE FIBULAR COMP LOCK 10H (Plate) ×3 IMPLANT
SANITIZER HAND PURELL 535ML FO (MISCELLANEOUS) ×3 IMPLANT
SCREW CORTICAL LOW PROF 3.5X20 (Screw) ×9 IMPLANT
SCREW LOCK 3.5X16 DIST TIB (Screw) ×3 IMPLANT
SCREW LOW PROFILE 12MMX3.5MM (Screw) ×3 IMPLANT
SCREW LP 3.5 (Screw) ×3 IMPLANT
SHEET MEDIUM DRAPE 40X70 STRL (DRAPES) ×3 IMPLANT
SLEEVE SCD COMPRESS KNEE MED (MISCELLANEOUS) ×3 IMPLANT
SPLINT FAST PLASTER 5X30 (CAST SUPPLIES) ×40
SPLINT PLASTER CAST FAST 5X30 (CAST SUPPLIES) ×20 IMPLANT
SPONGE LAP 18X18 X RAY DECT (DISPOSABLE) ×3 IMPLANT
STOCKINETTE 6  STRL (DRAPES) ×2
STOCKINETTE 6 STRL (DRAPES) ×1 IMPLANT
SUCTION FRAZIER TIP 10 FR DISP (SUCTIONS) ×3 IMPLANT
SUT ETHILON 3 0 PS 1 (SUTURE) ×3 IMPLANT
SUT FIBERWIRE #2 38 T-5 BLUE (SUTURE)
SUT MNCRL AB 3-0 PS2 18 (SUTURE) ×3 IMPLANT
SUT PDS AB 1 CT  36 (SUTURE) ×2
SUT PDS AB 1 CT 36 (SUTURE) ×1 IMPLANT
SUT VIC AB 0 SH 27 (SUTURE) IMPLANT
SUT VIC AB 2-0 SH 27 (SUTURE) ×2
SUT VIC AB 2-0 SH 27XBRD (SUTURE) ×1 IMPLANT
SUT VICRYL 4-0 PS2 18IN ABS (SUTURE) IMPLANT
SUTURE FIBERWR #2 38 T-5 BLUE (SUTURE) IMPLANT
SYR BULB 3OZ (MISCELLANEOUS) ×3 IMPLANT
SYR CONTROL 10ML LL (SYRINGE) ×3 IMPLANT
TOWEL OR 17X24 6PK STRL BLUE (TOWEL DISPOSABLE) ×6 IMPLANT
TOWEL OR NON WOVEN STRL DISP B (DISPOSABLE) IMPLANT
TUBE CONNECTING 20'X1/4 (TUBING) ×1
TUBE CONNECTING 20X1/4 (TUBING) ×2 IMPLANT
UNDERPAD 30X30 INCONTINENT (UNDERPADS AND DIAPERS) ×3 IMPLANT

## 2014-02-11 NOTE — Progress Notes (Signed)
Assisted Dr. Rob Fitzgerald with right, ultrasound guided, popliteal block. Side rails up, monitors on throughout procedure. See vital signs in flow sheet. Tolerated Procedure well. 

## 2014-02-11 NOTE — Anesthesia Postprocedure Evaluation (Signed)
  Anesthesia Post-op Note  Patient: [name]  Procedure(s) Performed: Procedure(s): OPEN REDUCTION INTERNAL FIXATION (ORIF) RIGHT  ANKLE FRACTURE AND CLOSED TREATMENT OF CALCANEAL FRACTURE (Right)  Patient Location: PACU  Anesthesia Type:GA combined with regional for post-op pain  Level of Consciousness: awake, alert  and oriented  Airway and Oxygen Therapy: Patient Spontanous Breathing  Post-op Pain: none  Post-op Assessment: Post-op Vital signs reviewed  Post-op Vital Signs: Reviewed  Last Vitals:  Filed Vitals:   02/11/14 1245  BP:   Pulse: 69  Temp:   Resp: 22    Complications: No apparent anesthesia complications
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
## 2014-02-11 NOTE — Anesthesia Preprocedure Evaluation (Addendum)
Anesthesia Evaluation  Patient identified by MRN, date of birth, ID band Patient awake    Reviewed: Allergy & Precautions, H&P , NPO status , Patient's Chart, lab work & pertinent test results  Airway Mallampati: II TM Distance: >3 FB Neck ROM: Full    Dental  (+) Teeth Intact, Dental Advisory Given   Pulmonary neg pulmonary ROS, Current Smoker,  breath sounds clear to auscultation        Cardiovascular negative cardio ROS  Rhythm:Regular Rate:Normal     Neuro/Psych negative neurological ROS  negative psych ROS   GI/Hepatic negative GI ROS, Neg liver ROS,   Endo/Other  negative endocrine ROS  Renal/GU negative Renal ROS     Musculoskeletal negative musculoskeletal ROS (+)   Abdominal   Peds  Hematology negative hematology ROS (+)   Anesthesia Other Findings   Reproductive/Obstetrics negative OB ROS                          Anesthesia Physical Anesthesia Plan  ASA: I  Anesthesia Plan: General   Post-op Pain Management:    Induction: Intravenous  Airway Management Planned: LMA  Additional Equipment:   Intra-op Plan:   Post-operative Plan:   Informed Consent: I have reviewed the patients History and Physical, chart, labs and discussed the procedure including the risks, benefits and alternatives for the proposed anesthesia with the patient or authorized representative who has indicated his/her understanding and acceptance.   Dental advisory given  Plan Discussed with: CRNA and Surgeon  Anesthesia Plan Comments:         Anesthesia Quick Evaluation

## 2014-02-11 NOTE — Transfer of Care (Signed)
Immediate Anesthesia Transfer of Care Note  Patient: [name]  Procedure(s) Performed: Procedure(s): OPEN REDUCTION INTERNAL FIXATION (ORIF) RIGHT  ANKLE FRACTURE AND CLOSED TREATMENT OF CALCANEAL FRACTURE (Right)  Patient Location: PACU  Anesthesia Type:GA combined with regional for post-op pain  Level of Consciousness: awake, alert , oriented and patient cooperative  Airway & Oxygen Therapy: Patient Spontanous Breathing and Patient connected to face mask oxygen  Post-op Assessment: Report given to PACU RN and Post -op Vital signs reviewed and stable  Post vital signs: Reviewed and stable  Complications: No apparent anesthesia complications
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
## 2014-02-11 NOTE — Anesthesia Procedure Notes (Addendum)
Anesthesia Regional Block:  Popliteal block  Pre-Anesthetic Checklist: ,, timeout performed, Correct Patient, Correct Site, Correct Laterality, Correct Procedure, Correct Position, site marked, Risks and benefits discussed,  Surgical consent,  Pre-op evaluation,  At surgeon's request and post-op pain management  Laterality: Right  Prep: chloraprep       Needles:  Injection technique: Single-shot  Needle Type: Echogenic Stimulator Needle     Needle Length: 9cm 9 cm Needle Gauge: 21 and 21 G    Additional Needles:  Procedures: ultrasound guided (picture in chart) Popliteal block  Nerve Stimulator or Paresthesia:  Response: Tibial, 0.6 mA,  Response: Peroneal, 0.48 mA,   Additional Responses:   Narrative:  Start time: 02/11/2014 8:50 AM End time: 02/11/2014 9:00 AM Injection made incrementally with aspirations every 5 mL.  Performed by: Personally  Anesthesiologist: Marcene Duosobert Fitzgerald, MD  Additional Notes: Risks and benefits of procedure explained to pt. Pt tolerated procedure well. No immediate complications noted. Total 30cc's 0.5% bupivicaine with 1:200k epi injected perineurally after confirmation with nerve stimulator.   Procedure Name: LMA Insertion Date/Time: 02/11/2014 9:10 AM Performed by: Kimari Lienhard Pre-anesthesia Checklist: Patient identified, Emergency Drugs available, Suction available and Patient being monitored Patient Re-evaluated:Patient Re-evaluated prior to inductionOxygen Delivery Method: Circle System Utilized Preoxygenation: Pre-oxygenation with 100% oxygen Intubation Type: IV induction Ventilation: Mask ventilation without difficulty LMA: LMA inserted LMA Size: 5.0 Number of attempts: 1 Airway Equipment and Method: bite block Placement Confirmation: positive ETCO2 Tube secured with: Tape Dental Injury: Teeth and Oropharynx as per pre-operative assessment

## 2014-02-11 NOTE — Discharge Instructions (Signed)
Post Anesthesia Home Care Instructions  Activity: Get plenty of rest for the remainder of the day. A responsible adult should stay with you for 24 hours following the procedure.  For the next 24 hours, DO NOT: -Drive a car -Advertising copywriterperate machinery -Drink alcoholic beverages -Take any medication unless instructed by your physician -Make any legal decisions or sign important papers.  Meals: Start with liquid foods such as gelatin or soup. Progress to regular foods as tolerated. Avoid greasy, spicy, heavy foods. If nausea and/or vomiting occur, drink only clear liquids until the nausea and/or vomiting subsides. Call your physician if vomiting continues.  Special Instructions/Symptoms: Your throat may feel dry or sore from the anesthesia or the breathing tube placed in your throat during surgery. If this causes discomfort, gargle with warm salt water. The discomfort should disappear within 24 hours.  Regional Anesthesia Blocks  1. Numbness or the inability to move the "blocked" extremity may last from 3-48 hours after placement. The length of time depends on the medication injected and your individual response to the medication. If the numbness is not going away after 48 hours, call your surgeon.  2. The extremity that is blocked will need to be protected until the numbness is gone and the  Strength has returned. Because you cannot feel it, you will need to take extra care to avoid injury. Because it may be weak, you may have difficulty moving it or using it. You may not know what position it is in without looking at it while the block is in effect.  3. For blocks in the legs and feet, returning to weight bearing and walking needs to be done carefully. You will need to wait until the numbness is entirely gone and the strength has returned. You should be able to move your leg and foot normally before you try and bear weight or walk. You will need someone to be with you when you first try to ensure you  do not fall and possibly risk injury.  4. Bruising and tenderness at the needle site are common side effects and will resolve in a few days.  5. Persistent numbness or new problems with movement should be communicated to the surgeon or the [facility] [phone])/ [facility] [phone]).  Toni ArthursJohn Andrian Sabala, MD [facility]  Please read the following information regarding your care after surgery.  Medications  You only need a prescription for the narcotic pain medicine (ex. oxycodone, Percocet, Norco).  All of the other medicines listed below are available over the counter. X acetominophen (Tylenol) 650 mg every 4-6 hours as you need for minor pain X oxycodone as prescribed for moderate to severe pain (START THE NEW PRESCRIPTION OF OXYCODONE ONLY IF YOU RUN OUT OF YOUR OLD PRESCRIPTION)  Narcotic pain medicine (ex. oxycodone, Percocet, Vicodin) will cause constipation.  To prevent this problem, take the following medicines while you are taking any pain medicine. x docusate sodium (Colace) 100 mg twice a day x senna (Senokot) 2 tablets twice a day  x To help prevent blood clots, take an aspirin (325 mg) once a day for a month after surgery.  You should also get up every hour while you are awake to move around.    Weight Bearing ? Bear weight when you are able on your operated leg or foot. ? Bear weight only on the heel of your operated foot in the post-op shoe. x Do not bear any weight on the operated leg or foot.  Cast / Splint / Dressing x Keep your splint or cast clean and dry.  Dont put anything (coat hanger, pencil, etc) down inside of it.  If it gets damp, use a hair dryer on the cool setting to dry it.  If it gets soaked, call the office to schedule an appointment for a cast change. ? Remove your dressing 3 days after surgery and cover the incisions with dry dressings.    After your dressing, cast or splint is removed; you may shower, but do  not soak or scrub the wound.  Allow the water to run over it, and then gently pat it dry.  Swelling It is normal for you to have swelling where you had surgery.  To reduce swelling and pain, keep your toes above your nose for at least 3 days after surgery.  It may be necessary to keep your foot or leg elevated for several weeks.  If it hurts, it should be elevated.  Follow Up Call my office at [phone] when you are discharged from the hospital or surgery center to schedule an appointment to be seen two weeks after surgery.  Call my office at [phone] if you develop a fever >101.5 F, nausea, vomiting, bleeding from the surgical site or severe pain.

## 2014-02-12 ENCOUNTER — Encounter (HOSPITAL_BASED_OUTPATIENT_CLINIC_OR_DEPARTMENT_OTHER): Payer: Self-pay | Admitting: Orthopedic Surgery
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
## 2014-02-19 DIAGNOSIS — T1490 Injury, unspecified: Secondary | ICD-10-CM

## 2014-02-19 DIAGNOSIS — S51811A Laceration without foreign body of right forearm, initial encounter: Secondary | ICD-10-CM

## 2014-02-19 DIAGNOSIS — S92001A Unspecified fracture of right calcaneus, initial encounter for closed fracture: Secondary | ICD-10-CM

## 2014-02-19 DIAGNOSIS — S82891A Other fracture of right lower leg, initial encounter for closed fracture: Secondary | ICD-10-CM

## 2014-04-26 NOTE — Pre-Procedure Instructions (Signed)
[name]  04/26/2014   Your procedure is scheduled on:  Thursday, December 17th  [facility] 230[facility].  Call this number if you have problems the morning of surgery: [phone]   Remember:   Do not eat food or drink liquids after midnight.   Take these medicines the morning of surgery with A SIP OF WATER: pain medication if needed   Do not wear jewelry.  Do not wear lotions, powders, or perfume,deodorant.  Do not shave 48 hours prior to surgery. Men may shave face and neck.  Do not bring valuables to the hospital.  [facility] is not responsible for any belongings or valuables.               Contacts, dentures or bridgework may not be worn into surgery.  Leave suitcase in the car. After surgery it may be brought to your room.  For patients admitted to the hospital, discharge time is determined by your  treatment team.               Patients discharged the day of surgery will not be allowed to drive home.  Please read over the following fact sheets that you were given: Pain Booklet, Coughing and Deep Breathing and Surgical Site Infection Prevention  [place] - Preparing for Surgery  Before surgery, you can play an important role.  Because skin is not sterile, your skin needs to be as free of germs as possible.  You can reduce the number of germs on you skin by washing with CHG (chlorahexidine gluconate) soap before surgery.  CHG is an antiseptic cleaner which kills germs and bonds with the skin to continue killing germs even after washing.  Please DO NOT use if you have an allergy to CHG or antibacterial soaps.  If your skin becomes reddened/irritated stop using the CHG and inform your nurse when you arrive at Short Stay.  Do not shave (including legs and underarms) for at least 48 hours prior to the first CHG shower.  You may shave your face.  Please follow these instructions carefully:   1.  Shower with CHG Soap the night before surgery  and the morning of Surgery.  2.  If you choose to wash your hair, wash your hair first as usual with your normal shampoo.  3.  After you shampoo, rinse your hair and body thoroughly to remove the shampoo.  4.  Use CHG as you would any other liquid soap.  You can apply CHG directly to the skin and wash gently with scrungie or a clean washcloth.  5.  Apply the CHG Soap to your body ONLY FROM THE NECK DOWN.  Do not use on open wounds or open sores.  Avoid contact with your eyes, ears, mouth and genitals (private parts).  Wash genitals (private parts) with your normal soap.  6.  Wash thoroughly, paying special attention to the area where your surgery will be performed.  7.  Thoroughly rinse your body with warm water from the neck down.  8.  DO NOT shower/wash with your normal soap after using and rinsing off the CHG Soap.  9.  Pat yourself dry with a clean towel.            10.  Wear clean pajamas.            11.  Place clean sheets on your bed the night of your first shower and do not sleep with pets.  Day of Surgery  Do not apply any lotions/deoderants the morning of surgery.  Please wear clean clothes to the hospital/surgery center.

## 2014-04-27 ENCOUNTER — Encounter (HOSPITAL_COMMUNITY): Payer: Self-pay

## 2014-04-27 ENCOUNTER — Encounter (HOSPITAL_COMMUNITY)
Admission: RE | Admit: 2014-04-27 | Discharge: 2014-04-27 | Disposition: A | Discharge status: Home / Self Care | Payer: Self-pay | Source: Ambulatory Visit | Attending: Orthopedic Surgery | Admitting: Orthopedic Surgery

## 2014-04-27 DIAGNOSIS — Z01812 Encounter for preprocedural laboratory examination: Secondary | ICD-10-CM | POA: Insufficient documentation

## 2014-04-27 HISTORY — DX: Headache: R51

## 2014-04-27 HISTORY — DX: Headache, unspecified: R51.9

## 2014-04-27 LAB — COMPREHENSIVE METABOLIC PANEL
ALK PHOS: 91 U/L (ref 39–117)
ALT: 10 U/L (ref 0–53)
AST: 17 U/L (ref 0–37)
Albumin: 4 g/dL (ref 3.5–5.2)
Anion gap: 11 (ref 5–15)
BUN: 7 mg/dL (ref 6–23)
CO2: 26 mEq/L (ref 19–32)
Calcium: 9.4 mg/dL (ref 8.4–10.5)
Chloride: 104 mEq/L (ref 96–112)
Creatinine, Ser: 0.77 mg/dL (ref 0.50–1.35)
GFR calc non Af Amer: >90 mL/min (ref >90)
GLUCOSE: 86 mg/dL (ref 70–99)
Potassium: 4.3 mEq/L (ref 3.7–5.3)
SODIUM: 141 meq/L (ref 137–147)
TOTAL PROTEIN: 7.2 g/dL (ref 6.0–8.3)
Total Bilirubin: 0.2 mg/dL — ABNORMAL LOW (ref 0.3–1.2)

## 2014-04-27 LAB — CBC
HCT: 39.1 % (ref 39.0–52.0)
HEMOGLOBIN: 13.4 g/dL (ref 13.0–17.0)
MCH: 30.7 pg (ref 26.0–34.0)
MCHC: 34.3 g/dL (ref 30.0–36.0)
MCV: 89.5 fL (ref 78.0–100.0)
PLATELETS: 336 10*3/uL (ref 150–400)
RBC: 4.37 MIL/uL (ref 4.22–5.81)
RDW: 13.1 % (ref 11.5–15.5)
WBC: 5.9 10*3/uL (ref 4.0–10.5)

## 2014-04-27 NOTE — Progress Notes (Signed)
No orders at PAT.   DA

## 2014-04-28 ENCOUNTER — Other Ambulatory Visit: Payer: Self-pay | Admitting: Orthopedic Surgery

## 2014-04-29 ENCOUNTER — Inpatient Hospital Stay (HOSPITAL_COMMUNITY)
Admission: RE | Admit: 2014-04-29 | Discharge: 2014-05-03 | DRG: 857 | Disposition: A | Discharge status: Home / Self Care | Payer: Self-pay | Source: Ambulatory Visit | Attending: Orthopedic Surgery | Admitting: Orthopedic Surgery

## 2014-04-29 ENCOUNTER — Inpatient Hospital Stay (HOSPITAL_COMMUNITY): Payer: Self-pay | Admitting: Certified Registered"

## 2014-04-29 ENCOUNTER — Encounter (HOSPITAL_COMMUNITY): Payer: Self-pay | Admitting: *Deleted

## 2014-04-29 ENCOUNTER — Encounter (HOSPITAL_COMMUNITY)
Admission: RE | Disposition: A | Discharge status: Home / Self Care | Payer: Self-pay | Source: Ambulatory Visit | Attending: Orthopedic Surgery

## 2014-04-29 DIAGNOSIS — F17210 Nicotine dependence, cigarettes, uncomplicated: Secondary | ICD-10-CM | POA: Diagnosis present

## 2014-04-29 DIAGNOSIS — M96830 Postprocedural hemorrhage and hematoma of a musculoskeletal structure following a musculoskeletal system procedure: Secondary | ICD-10-CM | POA: Diagnosis present

## 2014-04-29 DIAGNOSIS — Y793 Surgical instruments, materials and orthopedic devices (including sutures) associated with adverse incidents: Secondary | ICD-10-CM | POA: Diagnosis present

## 2014-04-29 DIAGNOSIS — T8132XA Disruption of internal operation (surgical) wound, not elsewhere classified, initial encounter: Secondary | ICD-10-CM | POA: Diagnosis present

## 2014-04-29 DIAGNOSIS — T8130XA Disruption of wound, unspecified, initial encounter: Secondary | ICD-10-CM | POA: Diagnosis present

## 2014-04-29 DIAGNOSIS — T814XXA Infection following a procedure, initial encounter: Principal | ICD-10-CM | POA: Diagnosis present

## 2014-04-29 HISTORY — PX: INCISION AND DRAINAGE: SHX5863

## 2014-04-29 LAB — CBC
HCT: 39 % (ref 39.0–52.0)
Hemoglobin: 13.3 g/dL (ref 13.0–17.0)
MCH: 30.3 pg (ref 26.0–34.0)
MCHC: 34.1 g/dL (ref 30.0–36.0)
MCV: 88.8 fL (ref 78.0–100.0)
PLATELETS: 322 10*3/uL (ref 150–400)
RBC: 4.39 MIL/uL (ref 4.22–5.81)
RDW: 13 % (ref 11.5–15.5)
WBC: 5.6 10*3/uL (ref 4.0–10.5)

## 2014-04-29 LAB — CREATININE, SERUM: Creatinine, Ser: 0.87 mg/dL (ref 0.50–1.35)

## 2014-04-29 SURGERY — INCISION AND DRAINAGE
Anesthesia: General | Site: Ankle | Laterality: Right

## 2014-04-29 MED ORDER — FENTANYL CITRATE 0.05 MG/ML IJ SOLN
INTRAMUSCULAR | Status: AC
  Filled 2014-04-29: qty 5

## 2014-04-29 MED ORDER — ONDANSETRON HCL 4 MG PO TABS: 4.0000 mg | ORAL_TABLET | Freq: Four times a day (QID) | ORAL | Status: DC | PRN

## 2014-04-29 MED ORDER — ONDANSETRON HCL 4 MG/2ML IJ SOLN
INTRAMUSCULAR | Status: AC
  Filled 2014-04-29: qty 2

## 2014-04-29 MED ORDER — FENTANYL CITRATE 0.05 MG/ML IJ SOLN
INTRAMUSCULAR | Status: DC | PRN
  Administered 2014-04-29: 100 ug via INTRAVENOUS
  Administered 2014-04-29 (×3): 50 ug via INTRAVENOUS

## 2014-04-29 MED ORDER — MIDAZOLAM HCL 2 MG/2ML IJ SOLN
INTRAMUSCULAR | Status: AC
  Filled 2014-04-29: qty 2

## 2014-04-29 MED ORDER — NAPROXEN 250 MG PO TABS
250.0000 mg | ORAL_TABLET | Freq: Two times a day (BID) | ORAL | Status: DC
  Administered 2014-04-29 – 2014-05-03 (×8): 250 mg via ORAL
  Filled 2014-04-29 (×9): qty 1

## 2014-04-29 MED ORDER — BUPIVACAINE-EPINEPHRINE (PF) 0.5% -1:200000 IJ SOLN
INTRAMUSCULAR | Status: AC
  Filled 2014-04-29: qty 30

## 2014-04-29 MED ORDER — DOCUSATE SODIUM 100 MG PO CAPS
100.0000 mg | ORAL_CAPSULE | Freq: Two times a day (BID) | ORAL | Status: DC
  Administered 2014-04-29 – 2014-05-03 (×8): 100 mg via ORAL
  Filled 2014-04-29 (×9): qty 1

## 2014-04-29 MED ORDER — SODIUM CHLORIDE 0.9 % IR SOLN
Status: DC | PRN
  Administered 2014-04-29: 3000 mL

## 2014-04-29 MED ORDER — ENOXAPARIN SODIUM 40 MG/0.4ML ~~LOC~~ SOLN
40.0000 mg | SUBCUTANEOUS | Status: DC
  Administered 2014-04-30 – 2014-05-02 (×3): 40 mg via SUBCUTANEOUS
  Filled 2014-04-29 (×5): qty 0.4

## 2014-04-29 MED ORDER — SENNA 8.6 MG PO TABS
1.0000 | ORAL_TABLET | Freq: Two times a day (BID) | ORAL | Status: DC
  Administered 2014-04-29 – 2014-05-03 (×8): 8.6 mg via ORAL
  Filled 2014-04-29 (×9): qty 1

## 2014-04-29 MED ORDER — OXYCODONE HCL 5 MG PO TABS
5.0000 mg | ORAL_TABLET | ORAL | Status: DC | PRN
  Administered 2014-04-29 – 2014-05-03 (×14): 10 mg via ORAL
  Filled 2014-04-29 (×15): qty 2

## 2014-04-29 MED ORDER — ONDANSETRON HCL 4 MG/2ML IJ SOLN: 4.0000 mg | Freq: Four times a day (QID) | INTRAMUSCULAR | Status: DC | PRN

## 2014-04-29 MED ORDER — VANCOMYCIN HCL IN DEXTROSE 1-5 GM/200ML-% IV SOLN
INTRAVENOUS | Status: AC
  Administered 2014-04-29: 1000 mg via INTRAVENOUS
  Filled 2014-04-29: qty 200

## 2014-04-29 MED ORDER — LACTATED RINGERS IV SOLN
INTRAVENOUS | Status: DC
  Administered 2014-04-29: 15:00:00 via INTRAVENOUS

## 2014-04-29 MED ORDER — BUPIVACAINE-EPINEPHRINE (PF) 0.5% -1:200000 IJ SOLN
INTRAMUSCULAR | Status: DC | PRN
  Administered 2014-04-29: 16 mL

## 2014-04-29 MED ORDER — HYDROMORPHONE HCL 1 MG/ML IJ SOLN
0.5000 mg | INTRAMUSCULAR | Status: DC | PRN
  Administered 2014-04-29 – 2014-05-02 (×7): 1 mg via INTRAVENOUS
  Filled 2014-04-29 (×7): qty 1

## 2014-04-29 MED ORDER — HYDROMORPHONE HCL 1 MG/ML IJ SOLN
0.2500 mg | INTRAMUSCULAR | Status: DC | PRN
  Administered 2014-04-29 (×2): 0.5 mg via INTRAVENOUS

## 2014-04-29 MED ORDER — PROPOFOL 10 MG/ML IV BOLUS
INTRAVENOUS | Status: DC | PRN
  Administered 2014-04-29: 150 mg via INTRAVENOUS
  Administered 2014-04-29: 50 mg via INTRAVENOUS

## 2014-04-29 MED ORDER — LIDOCAINE HCL (CARDIAC) 20 MG/ML IV SOLN
INTRAVENOUS | Status: DC | PRN
  Administered 2014-04-29: 40 mg via INTRAVENOUS

## 2014-04-29 MED ORDER — HYDROMORPHONE HCL 1 MG/ML IJ SOLN
INTRAMUSCULAR | Status: AC
  Filled 2014-04-29: qty 1

## 2014-04-29 MED ORDER — VANCOMYCIN HCL IN DEXTROSE 1-5 GM/200ML-% IV SOLN
1000.0000 mg | Freq: Three times a day (TID) | INTRAVENOUS | Status: DC
  Administered 2014-04-30 – 2014-05-01 (×5): 1000 mg via INTRAVENOUS
  Filled 2014-04-29 (×6): qty 200

## 2014-04-29 MED ORDER — 0.9 % SODIUM CHLORIDE (POUR BTL) OPTIME
TOPICAL | Status: DC | PRN
  Administered 2014-04-29: 1000 mL

## 2014-04-29 MED ORDER — MIDAZOLAM HCL 5 MG/5ML IJ SOLN
INTRAMUSCULAR | Status: DC | PRN
  Administered 2014-04-29: 2 mg via INTRAVENOUS

## 2014-04-29 MED ORDER — PROPOFOL 10 MG/ML IV BOLUS
INTRAVENOUS | Status: AC
  Filled 2014-04-29: qty 20

## 2014-04-29 MED ORDER — ONDANSETRON HCL 4 MG/2ML IJ SOLN
INTRAMUSCULAR | Status: DC | PRN
  Administered 2014-04-29: 4 mg via INTRAVENOUS

## 2014-04-29 MED ORDER — SODIUM CHLORIDE 0.9 % IV SOLN
INTRAVENOUS | Status: DC
  Administered 2014-04-29: 20:00:00 via INTRAVENOUS

## 2014-04-29 SURGICAL SUPPLY — 54 items
BANDAGE ELASTIC 4 VELCRO ST LF (GAUZE/BANDAGES/DRESSINGS) IMPLANT
BANDAGE ESMARK 6X9 LF (GAUZE/BANDAGES/DRESSINGS) ×1 IMPLANT
BLADE SURG 10 STRL SS (BLADE) IMPLANT
BNDG COHESIVE 4X5 TAN STRL (GAUZE/BANDAGES/DRESSINGS) ×3 IMPLANT
BNDG COHESIVE 6X5 TAN STRL LF (GAUZE/BANDAGES/DRESSINGS) ×3 IMPLANT
BNDG CONFORM 3 STRL LF (GAUZE/BANDAGES/DRESSINGS) IMPLANT
BNDG ESMARK 6X9 LF (GAUZE/BANDAGES/DRESSINGS) ×3
CANISTER SUCT 3000ML (MISCELLANEOUS) IMPLANT
CHLORAPREP W/TINT 26ML (MISCELLANEOUS) ×6 IMPLANT
COVER SURGICAL LIGHT HANDLE (MISCELLANEOUS) ×3 IMPLANT
CUFF TOURNIQUET SINGLE 34IN LL (TOURNIQUET CUFF) ×3 IMPLANT
CUFF TOURNIQUET SINGLE 44IN (TOURNIQUET CUFF) IMPLANT
DRAIN CHANNEL 10M FLAT 3/4 FLT (DRAIN) IMPLANT
DRAIN PENROSE 1/2X12 LTX STRL (WOUND CARE) IMPLANT
DRAPE U-SHAPE 47X51 STRL (DRAPES) ×3 IMPLANT
DRSG ADAPTIC 3X8 NADH LF (GAUZE/BANDAGES/DRESSINGS) IMPLANT
DRSG MEPITEL 4X7.2 (GAUZE/BANDAGES/DRESSINGS) ×3 IMPLANT
DRSG PAD ABDOMINAL 8X10 ST (GAUZE/BANDAGES/DRESSINGS) ×6 IMPLANT
ELECT REM PT RETURN 9FT ADLT (ELECTROSURGICAL) ×3
ELECTRODE REM PT RTRN 9FT ADLT (ELECTROSURGICAL) ×1 IMPLANT
EVACUATOR SILICONE 100CC (DRAIN) IMPLANT
GAUZE SPONGE 4X4 12PLY STRL (GAUZE/BANDAGES/DRESSINGS) ×3 IMPLANT
GLOVE BIO SURGEON STRL SZ7 (GLOVE) ×3 IMPLANT
GLOVE BIO SURGEON STRL SZ8 (GLOVE) ×3 IMPLANT
GLOVE BIOGEL PI IND STRL 8 (GLOVE) ×1 IMPLANT
GLOVE BIOGEL PI INDICATOR 8 (GLOVE) ×2
GOWN STRL REUS W/ TWL XL LVL3 (GOWN DISPOSABLE) ×2 IMPLANT
GOWN STRL REUS W/TWL XL LVL3 (GOWN DISPOSABLE) ×4
KIT BASIN OR (CUSTOM PROCEDURE TRAY) ×3 IMPLANT
KIT ROOM TURNOVER OR (KITS) ×3 IMPLANT
MANIFOLD NEPTUNE II (INSTRUMENTS) ×3 IMPLANT
NS IRRIG 1000ML POUR BTL (IV SOLUTION) ×3 IMPLANT
PACK ORTHO EXTREMITY (CUSTOM PROCEDURE TRAY) ×3 IMPLANT
PAD ARMBOARD 7.5X6 YLW CONV (MISCELLANEOUS) ×6 IMPLANT
PAD CAST 4YDX4 CTTN HI CHSV (CAST SUPPLIES) ×2 IMPLANT
PADDING CAST COTTON 4X4 STRL (CAST SUPPLIES) ×4
PADDING CAST COTTON 6X4 STRL (CAST SUPPLIES) ×3 IMPLANT
SET CYSTO W/LG BORE CLAMP LF (SET/KITS/TRAYS/PACK) ×3 IMPLANT
SOAP 2 % CHG 4 OZ (WOUND CARE) IMPLANT
SPONGE LAP 4X18 X RAY DECT (DISPOSABLE) IMPLANT
STAPLER VISISTAT 35W (STAPLE) IMPLANT
SUCTION FRAZIER TIP 10 FR DISP (SUCTIONS) ×3 IMPLANT
SUT ETHILON 2 0 PSLX (SUTURE) ×6 IMPLANT
SUT ETHILON 3 0 FSL (SUTURE) IMPLANT
SUT PROLENE 3 0 PS 2 (SUTURE) IMPLANT
SUT VIC AB 2-0 CT1 27 (SUTURE)
SUT VIC AB 2-0 CT1 TAPERPNT 27 (SUTURE) IMPLANT
SUT VIC AB 3-0 PS2 18 (SUTURE)
SUT VIC AB 3-0 PS2 18XBRD (SUTURE) IMPLANT
TOWEL OR 17X24 6PK STRL BLUE (TOWEL DISPOSABLE) ×3 IMPLANT
TOWEL OR 17X26 10 PK STRL BLUE (TOWEL DISPOSABLE) ×3 IMPLANT
TUBE CONNECTING 12'X1/4 (SUCTIONS) ×1
TUBE CONNECTING 12X1/4 (SUCTIONS) ×2 IMPLANT
YANKAUER SUCT BULB TIP NO VENT (SUCTIONS) ×3 IMPLANT
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
## 2014-04-29 NOTE — Anesthesia Preprocedure Evaluation (Signed)
Anesthesia Evaluation  Patient identified by MRN, date of birth, ID band Patient awake    Reviewed: Allergy & Precautions, H&P , NPO status , Patient's Chart, lab work & pertinent test results  Airway Mallampati: II  TM Distance: >3 FB Neck ROM: Full    Dental no notable dental hx. (+) Teeth Intact, Dental Advisory Given   Pulmonary asthma , Current Smoker,  breath sounds clear to auscultation  Pulmonary exam normal       Cardiovascular negative cardio ROS  Rhythm:Regular Rate:Normal     Neuro/Psych negative neurological ROS  negative psych ROS   GI/Hepatic negative GI ROS, Neg liver ROS,   Endo/Other  negative endocrine ROS  Renal/GU negative Renal ROS  negative genitourinary   Musculoskeletal   Abdominal   Peds  Hematology negative hematology ROS (+)   Anesthesia Other Findings   Reproductive/Obstetrics negative OB ROS                             Anesthesia Physical Anesthesia Plan  ASA: II  Anesthesia Plan: General   Post-op Pain Management:    Induction: Intravenous  Airway Management Planned: LMA  Additional Equipment:   Intra-op Plan:   Post-operative Plan: Extubation in OR  Informed Consent: I have reviewed the patients History and Physical, chart, labs and discussed the procedure including the risks, benefits and alternatives for the proposed anesthesia with the patient or authorized representative who has indicated his/her understanding and acceptance.   Dental advisory given  Plan Discussed with: CRNA  Anesthesia Plan Comments:         Anesthesia Quick Evaluation

## 2014-04-29 NOTE — Anesthesia Procedure Notes (Signed)
Procedure Name: LMA Insertion Date/Time: 04/29/2014 4:13 PM Performed by: Charm BargesBUTLER, Vijay Durflinger R Pre-anesthesia Checklist: Patient identified, Emergency Drugs available, Suction available, Patient being monitored and Timeout performed Patient Re-evaluated:Patient Re-evaluated prior to inductionOxygen Delivery Method: Circle system utilized Preoxygenation: Pre-oxygenation with 100% oxygen Intubation Type: IV induction Ventilation: Mask ventilation without difficulty LMA: LMA inserted LMA Size: 5.0 Number of attempts: 1 Placement Confirmation: positive ETCO2 and breath sounds checked- equal and bilateral Tube secured with: Tape Dental Injury: Teeth and Oropharynx as per pre-operative assessment

## 2014-04-29 NOTE — Brief Op Note (Signed)
04/29/2014  4:57 PM  PATIENT:  Nicholas PatienceBrandon D Leachman  33 y.o. male  PRE-OPERATIVE DIAGNOSIS: 1.  Right ankle wound dehiscence s/p ORIF of right ankle fracture      2.  Right ankle post operative hematoma  POST-OPERATIVE DIAGNOSIS: Same  Procedure(s): 1.  Irrigation and debridement of right ankle wound   2.  Removal of deep implants from the right ankle  SURGEON:  Toni ArthursJohn Brandilynn Taormina, MD  ASSISTANT: n/a  ANESTHESIA:   General  EBL:  minimal   TOURNIQUET:   Total Tourniquet Time Documented: Thigh (Right) - 23 minutes Total: Thigh (Right) - 23 minutes   COMPLICATIONS:  None apparent  DISPOSITION:  Extubated, awake and stable to recovery.  DICTATION ID:  [id number]

## 2014-04-29 NOTE — Transfer of Care (Signed)
Immediate Anesthesia Transfer of Care Note  Patient: [name]  Procedure(s) Performed: Procedure(s): INCISION AND DRAINAGE AND RIGHT ankle wound and  REMOVAL DEEP IMPLANT (Right)  Patient Location: PACU  Anesthesia Type:General  Level of Consciousness: awake, alert  and oriented  Airway & Oxygen Therapy: Patient Spontanous Breathing and Patient connected to nasal cannula oxygen  Post-op Assessment: Report given to PACU RN, Post -op Vital signs reviewed and stable and Patient moving all extremities  Post vital signs: Reviewed and stable  Complications: No apparent anesthesia complications

## 2014-04-29 NOTE — Anesthesia Postprocedure Evaluation (Signed)
  Anesthesia Post-op Note  Patient: [name]  Procedure(s) Performed: Procedure(s): INCISION AND DRAINAGE AND RIGHT ankle wound and  REMOVAL DEEP IMPLANT (Right)  Patient Location: PACU  Anesthesia Type:General  Level of Consciousness: awake, sleeping when not aroused  Airway and Oxygen Therapy: Patient Spontanous Breathing and Patient connected to nasal cannula oxygen  Post-op Pain: none  Post-op Assessment: Post-op Vital signs reviewed, Patient's Cardiovascular Status Stable, Respiratory Function Stable, Patent Airway, No signs of Nausea or vomiting and Pain level controlled  Post-op Vital Signs: stable  Last Vitals:  Filed Vitals:   04/29/14 1800  BP:   Pulse: 49  Temp:   Resp: 16    Complications: No apparent anesthesia complications
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
## 2014-04-30 ENCOUNTER — Encounter (HOSPITAL_COMMUNITY): Payer: Self-pay | Admitting: Orthopedic Surgery

## 2014-04-30 DIAGNOSIS — T8131XA Disruption of external operation (surgical) wound, not elsewhere classified, initial encounter: Secondary | ICD-10-CM

## 2014-04-30 MED ORDER — DIPHENHYDRAMINE HCL 25 MG PO CAPS
25.0000 mg | ORAL_CAPSULE | Freq: Four times a day (QID) | ORAL | Status: DC | PRN
  Administered 2014-04-30 (×2): 25 mg via ORAL
  Filled 2014-04-30 (×2): qty 1

## 2014-04-30 NOTE — Progress Notes (Signed)
Utilization review completed.  

## 2014-04-30 NOTE — Progress Notes (Signed)
Subjective: 1 Day Post-Op Procedure(s) (LRB): INCISION AND DRAINAGE AND RIGHT ankle wound and  REMOVAL DEEP IMPLANT (Right) Patient reports pain as mild.  Pt c/o itching.  Objective: Vital signs in last 24 hours: Temp:  [98 F (36.7 C)-99.1 F (37.3 C)] 98.2 F (36.8 C) (12/18 0532) Pulse Rate:  [45-70] 46 (12/18 0532) Resp:  [10-24] 17 (12/18 0532) BP: (90-141)/(42-121) 90/42 mmHg (12/18 0532) SpO2:  [92 %-100 %] 100 % (12/18 0532) Weight:  [84.097 kg (185 lb 6.4 oz)] 84.097 kg (185 lb 6.4 oz) (12/17 1448)  Intake/Output from previous day: 12/17 0701 - 12/18 0700 In: 2047.5 [P.O.:480; I.V.:1567.5] Out: 325 [Urine:325] Intake/Output this shift:     Recent Labs  04/27/14 1532 04/29/14 1451  HGB 13.4 13.3    Recent Labs  04/27/14 1532 04/29/14 1451  WBC 5.9 5.6  RBC 4.37 4.39  HCT 39.1 39.0  PLT 336 322    Recent Labs  04/27/14 1532 04/29/14 2011  NA 141  --   K 4.3  --   CL 104  --   CO2 26  --   BUN 7  --   CREATININE 0.77 0.87  GLUCOSE 86  --   CALCIUM 9.4  --    No results for input(s): LABPT, INR in the last 72 hours.  PE:  wn wd male in nad.  R LE splinted.  NVI at toes.  Assessment/Plan: 1 Day Post-Op Procedure(s) (LRB): INCISION AND DRAINAGE AND RIGHT ankle wound and  REMOVAL DEEP IMPLANT (Right) Await culture results.  Continue vanc.  ID consult pending.  NWB on R LE.  [name] 04/30/2014, 8:38 AM
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
## 2014-05-01 DIAGNOSIS — T814XXA Infection following a procedure, initial encounter: Principal | ICD-10-CM

## 2014-05-01 LAB — SEDIMENTATION RATE: Sed Rate: 6 mm/hr (ref 0–16)

## 2014-05-01 LAB — C-REACTIVE PROTEIN: CRP: 3.3 mg/dL — ABNORMAL HIGH (ref <0.60)

## 2014-05-01 MED ORDER — LEVOFLOXACIN 750 MG PO TABS
750.0000 mg | ORAL_TABLET | Freq: Every day | ORAL | Status: DC
  Administered 2014-05-01 – 2014-05-03 (×3): 750 mg via ORAL
  Filled 2014-05-01 (×3): qty 1

## 2014-05-01 NOTE — Progress Notes (Signed)
Subjective: 2 Days Post-Op Procedure(s) (LRB): INCISION AND DRAINAGE AND RIGHT ankle wound and  REMOVAL DEEP IMPLANT (Right) Patient reports pain as mild to right ankle. Well controlled with PO meds.  Tolerating PO's well. No CP, SOB, no calf pain. No RLE paresthesias. Tolerating current pain.  Objective: Vital signs in last 24 hours: Temp:  [97.9 F (36.6 C)-98.9 F (37.2 C)] 98 F (36.7 C) (12/19 0545) Pulse Rate:  [43-56] 43 (12/19 0545) Resp:  [18] 18 (12/18 1400) BP: (95-118)/(49-70) 95/49 mmHg (12/19 0545) SpO2:  [100 %] 100 % (12/19 0545)  Intake/Output from previous day: 12/18 0701 - 12/19 0700 In: 1360 [P.O.:960; IV Piggyback:400] Out: 1225 [Urine:1225] Intake/Output this shift:     Recent Labs  04/29/14 1451  HGB 13.3    Recent Labs  04/29/14 1451  WBC 5.6  RBC 4.39  HCT 39.0  PLT 322    Recent Labs  04/29/14 2011  CREATININE 0.87   No results for input(s): LABPT, INR in the last 72 hours.  Well nourished. Alert and oriented x3. RRR, Lungs clear, BS x4. Abdomen soft and non tender. Right Calf soft and non tender. Right ankle slpint C/D/I. No DVT signs. Compartment soft. No signs of infection.  Right LE neurovascular intact. RLE Capillary refill less then 3 seconds.   Assessment/Plan: 2 Days Post-Op Procedure(s) (LRB): INCISION AND DRAINAGE AND RIGHT ankle wound and  REMOVAL DEEP IMPLANT (Right) Awaiting Cultures, I&D following Possible PICC for IV vs PO ABX Continue Vanc NWB RLE Continue current care  [name] L 05/01/2014, 10:17 AM

## 2014-05-01 NOTE — Progress Notes (Signed)
    [facility]  Date of Admission:  04/29/2014  Antibiotics: vancomyciin - stopped levaquin day 1  Subjective: No new complaints  Objective: Temp:  [97.9 F (36.6 C)-98.9 F (37.2 C)] 98 F (36.7 C) (12/19 0545) Pulse Rate:  [43-56] 43 (12/19 0545) Resp:  [18] 18 (12/18 1400) BP: (95-118)/(49-70) 95/49 mmHg (12/19 0545) SpO2:  [100 %] 100 % (12/19 0545)  General: awake, alert, nad Skin: no rashes Lungs: CTA Ext: right foot in cast  Lab Results Lab Results  Component Value Date   WBC 5.6 04/29/2014   HGB 13.3 04/29/2014   HCT 39.0 04/29/2014   MCV 88.8 04/29/2014   PLT 322 04/29/2014    Lab Results  Component Value Date   CREATININE 0.87 04/29/2014   BUN 7 04/27/2014   NA 141 04/27/2014   K 4.3 04/27/2014   CL 104 04/27/2014   CO2 26 04/27/2014    Lab Results  Component Value Date   ALT 10 04/27/2014   AST 17 04/27/2014   ALKPHOS 91 04/27/2014   BILITOT 0.2* 04/27/2014      Microbiology: Recent Results (from the past 240 hour(s))  Anaerobic culture     Status: None (Preliminary result)   Collection Time: 04/29/14  4:32 PM  Result Value Ref Range Status   Specimen Description TISSUE RIGHT FOOT  Final   Special Requests NONE  Final   Gram Stain   Final    FEW WBC PRESENT,BOTH PMN AND MONONUCLEAR NO ORGANISMS SEEN Performed at [facility]    Culture   Final    NO ANAEROBES ISOLATED; CULTURE IN PROGRESS FOR 5 DAYS Performed at [facility]    Report Status PENDING  Incomplete  Tissue culture     Status: None (Preliminary result)   Collection Time: 04/29/14  4:32 PM  Result Value Ref Range Status   Specimen Description TISSUE RIGHT FOOT  Final   Special Requests NONE  Final   Gram Stain   Final    FEW WBC PRESENT,BOTH PMN AND MONONUCLEAR NO ORGANISMS SEEN Performed at [facility]    Culture   Final    FEW GRAM NEGATIVE RODS Performed at [facility]    Report Status PENDING   Incomplete    Studies/Results: No results found.  Assessment/Plan:  1) post op wound infection, deep infection, contact with bone - now growing GNR.  Biofilm in OR noted suggesting Pseudomonas.  I have started po levaquin and hopefully will be sensitive (no recent exposure that I can tell). -if sensitive to levaquin, would use 750 mg levaquin daily for about 4 weeks, no picc needed -would need picc if not sensitive to levaquin.    Nicholas RighterOMER, Nicholas Iiams, MD [facility] [email or web address] [phone] pager   [phone] cell 05/01/2014, 11:11 AM

## 2014-05-02 LAB — TISSUE CULTURE

## 2014-05-02 NOTE — Progress Notes (Signed)
Subjective: 3 Days Post-Op Procedure(s) (LRB): INCISION AND DRAINAGE AND RIGHT ankle wound and  REMOVAL DEEP IMPLANT (Right) Patient reports pain as 2 on 0-10 scale.No major changes today. Starteed on Levaquin and to be seen by Infectious Disease Consult.Afebrile.    Objective: Vital signs in last 24 hours: Temp:  [97.8 F (36.6 C)-98.7 F (37.1 C)] 97.8 F (36.6 C) (12/20 0602) Pulse Rate:  [42-56] 42 (12/20 0602) Resp:  [18] 18 (12/20 0602) BP: (100-123)/(49-62) 100/49 mmHg (12/20 0602) SpO2:  [99 %-100 %] 100 % (12/20 0602)  Intake/Output from previous day: 12/19 0701 - 12/20 0700 In: 600 [P.O.:600] Out: 2150 [Urine:2150] Intake/Output this shift:     Recent Labs  04/29/14 1451  HGB 13.3    Recent Labs  04/29/14 1451  WBC 5.6  RBC 4.39  HCT 39.0  PLT 322    Recent Labs  04/29/14 2011  CREATININE 0.87   No results for input(s): LABPT, INR in the last 72 hours.  Sensation intact distally  Assessment/Plan: 3 Days Post-Op Procedure(s) (LRB): INCISION AND DRAINAGE AND RIGHT ankle wound and  REMOVAL DEEP IMPLANT (Right) Up with therapy  [name] 05/02/2014, 7:48 AM

## 2014-05-02 NOTE — Progress Notes (Signed)
[name] for Infectious Disease  Date of Admission:  04/29/2014  Antibiotics: vancomyciin - stopped levaquin day 2  Subjective: No new issues  Objective: Temp:  [97.8 F (36.6 C)-98.7 F (37.1 C)] 97.8 F (36.6 C) (12/20 0602) Pulse Rate:  [42-56] 42 (12/20 0602) Resp:  [18] 18 (12/20 0602) BP: (100-123)/(49-62) 100/49 mmHg (12/20 0602) SpO2:  [99 %-100 %] 100 % (12/20 0602)  General: awake, alert, nad Skin: no rashes Lungs: CTA Ext: right foot in cast  Lab Results Lab Results  Component Value Date   WBC 5.6 04/29/2014   HGB 13.3 04/29/2014   HCT 39.0 04/29/2014   MCV 88.8 04/29/2014   PLT 322 04/29/2014    Lab Results  Component Value Date   CREATININE 0.87 04/29/2014   BUN 7 04/27/2014   NA 141 04/27/2014   K 4.3 04/27/2014   CL 104 04/27/2014   CO2 26 04/27/2014    Lab Results  Component Value Date   ALT 10 04/27/2014   AST 17 04/27/2014   ALKPHOS 91 04/27/2014   BILITOT 0.2* 04/27/2014      Microbiology: Recent Results (from the past 240 hour(s))  Anaerobic culture     Status: None (Preliminary result)   Collection Time: 04/29/14  4:32 PM  Result Value Ref Range Status   Specimen Description TISSUE RIGHT FOOT  Final   Special Requests NONE  Final   Gram Stain   Final    FEW WBC PRESENT,BOTH PMN AND MONONUCLEAR NO ORGANISMS SEEN Performed at [facility]    Culture   Final    NO ANAEROBES ISOLATED; CULTURE IN PROGRESS FOR 5 DAYS Performed at [facility]    Report Status PENDING  Incomplete  Tissue culture     Status: None   Collection Time: 04/29/14  4:32 PM  Result Value Ref Range Status   Specimen Description TISSUE RIGHT FOOT  Final   Special Requests NONE  Final   Gram Stain   Final    FEW WBC PRESENT,BOTH PMN AND MONONUCLEAR NO ORGANISMS SEEN Performed at [facility]    Culture   Final    FEW ENTEROBACTER CLOACAE Performed at [facility]    Report Status 05/02/2014 FINAL  Final   Organism ID, Bacteria ENTEROBACTER CLOACAE  Final      Susceptibility   Enterobacter cloacae - MIC*    CEFAZOLIN >=64 RESISTANT Resistant     CEFEPIME <=1 SENSITIVE Sensitive     CEFTAZIDIME <=1 SENSITIVE Sensitive     CEFTRIAXONE <=1 SENSITIVE Sensitive     CIPROFLOXACIN <=0.25 SENSITIVE Sensitive     GENTAMICIN <=1 SENSITIVE Sensitive     IMIPENEM 0.5 SENSITIVE Sensitive     PIP/TAZO <=4 SENSITIVE Sensitive     TOBRAMYCIN <=1 SENSITIVE Sensitive     TRIMETH/SULFA <=20 SENSITIVE Sensitive     * FEW ENTEROBACTER CLOACAE    Studies/Results: No results found.  Assessment/Plan:  1) post op wound infection, deep infection, contact with bone, viable bone noted in OR - now pansensitive E cloacae.  -continue levaquin 750 mg daily for 3-4 weeks -crp and ESR noted We will arrange follow up in [place] [place] [facility] [place] [email or web address] [phone] pager   [phone] cell 05/02/2014, 12:20 PM

## 2014-05-03 ENCOUNTER — Telehealth: Payer: Self-pay | Admitting: Infectious Diseases

## 2014-05-03 MED ORDER — LEVOFLOXACIN 750 MG PO TABS: 750.0000 mg | ORAL_TABLET | Freq: Every day | ORAL | Status: DC

## 2014-05-03 MED ORDER — ASPIRIN EC 325 MG PO TBEC: 325.0000 mg | DELAYED_RELEASE_TABLET | Freq: Every day | ORAL | Status: DC

## 2014-05-03 MED ORDER — HYDROCODONE-ACETAMINOPHEN 5-325 MG PO TABS: 1.0000 | ORAL_TABLET | Freq: Four times a day (QID) | ORAL | Status: DC | PRN

## 2014-05-03 NOTE — Progress Notes (Signed)
D/C instructions and scripts given to Pt. Pt verbalized understanding of home care and knows to pick up Abx at his pharmacy today. I admin his Levaquin dose PO prior to D/C per Dr Hewitt's request. Pts mom is at the bedside to take Pt home at this time.

## 2014-05-03 NOTE — Telephone Encounter (Signed)
Mr Nicholas DollyMoses mom called to let us know that he cannot afford the levaquin.  i suggested we would call her back in the AM, possibly they [facility] at a more [facility] price.

## 2014-05-03 NOTE — Discharge Summary (Signed)
Physician Discharge Summary  Patient ID: [name] MRN: [id number] DOB/AGE: 10/19/1980 33 y.o.  Admit date: 04/29/2014 Discharge date: 05/03/2014  Admission Diagnoses:  Right ankle wound dehiscence s/p ORIF of right ankle fracture  Discharge Diagnoses:  Active Problems:   Wound dehiscence right ankle infection s/p I and D and removal of hardware.  Discharged Condition: stable  Hospital Course: Pt was admitted and taken to the OR on 12/17.  He underwent I and D of the wound with cultures and removal of his hardware.  He did well post op.  Cultures grew enterobacter, and he was started on levaquin by Dr. Luciana Axeomer.  He is discharged to home in stable condition.  He is NWB on the R LE.  He will need 4 weeks of levaquin per Dr. Ephriam Knucklesomer's notes.  Consults: ID  Significant Diagnostic Studies: microbiology: see above  Treatments: surgery: as above  Discharge Exam: Blood pressure 103/47, pulse 49, temperature 98.5 F (36.9 C), temperature source Oral, resp. rate 16, height 5\' 11"  (1.803 m), weight 84.097 kg (185 lb 6.4 oz), SpO2 100 %. wn wd male in nad.  A and O x 4.  Mood and affect normal.  EOMI.  resp unlabored.  R ankle splinted.  NVI at right foot.  Disposition: 01-Home or Self Care  Discharge Instructions    Call MD / Call 911    Complete by:  As directed   If you experience chest pain or shortness of breath, CALL 911 and be transported to the hospital emergency room.  If you develope a fever above 101 F, pus (white drainage) or increased drainage or redness at the wound, or calf pain, call your surgeon's office.     Constipation Prevention    Complete by:  As directed   Drink plenty of fluids.  Prune juice may be helpful.  You may use a stool softener, such as Colace (over the counter) 100 mg twice a day.  Use MiraLax (over the counter) for constipation as needed.     Diet - low sodium heart healthy    Complete by:  As directed      Increase activity slowly as tolerated     Complete by:  As directed      Non weight bearing    Complete by:  As directed   Laterality:  right  Extremity:  Lower            Medication List    STOP taking these medications        morphine 15 MG 12 hr tablet  Commonly known as:  MS CONTIN     oxyCODONE 5 MG immediate release tablet  Commonly known as:  ROXICODONE     Oxycodone HCl 10 MG Tabs      TAKE these medications        ALEVE 220 MG tablet  Generic drug:  naproxen sodium  Take 220 mg by mouth 2 (two) times daily.     aspirin EC 325 MG tablet  Take 1 tablet (325 mg total) by mouth daily.     HYDROcodone-acetaminophen 5-325 MG per tablet  Commonly known as:  NORCO  Take 1 tablet by mouth every 6 (six) hours as needed for moderate pain.     levofloxacin 750 MG tablet  Commonly known as:  LEVAQUIN  Take 1 tablet (750 mg total) by mouth daily.     multivitamin with minerals Tabs tablet  Take 1 tablet by mouth daily.  Follow-up Information    Follow up with Danny Yackley, Jonny RuizJOHN, MD. Schedule an appointment as soon as possible for a visit in 10 days.   Specialty:  Orthopedic Surgery   Contact information:   [address] [place] [address] [phone]       Follow up with Staci RighterOMER, ROBERT, MD. Schedule an appointment as soon as possible for a visit in 4 weeks.   Specialty:  Infectious Diseases   Contact information:   [address] [place] [address] [phone]       Signed: Toni ArthursHEWITT, Bryon Parker 05/03/2014, 7:20 AM

## 2014-05-03 NOTE — Discharge Instructions (Signed)
Toni ArthursJohn Lariyah Shetterly, MD [facility]  Please read the following information regarding your care after surgery.  Medications  You only need a prescription for the narcotic pain medicine (ex. oxycodone, Percocet, Norco).  All of the other medicines listed below are available over the counter. X acetominophen (Tylenol) 650 mg every 4-6 hours as you need for minor pain X oxycodone as prescribed for moderate to severe pain X Levaquin as prescribed for 4 weeks.   Narcotic pain medicine (ex. oxycodone, Percocet, Vicodin) will cause constipation.  To prevent this problem, take the following medicines while you are taking any pain medicine. ? docusate sodium (Colace) 100 mg twice a day ? senna (Senokot) 2 tablets twice a day  X To help prevent blood clots, take an aspirin (325 mg) once a day for a month after surgery.  You should also get up every hour while you are awake to move around.    Weight Bearing X Do not bear any weight on the operated leg or foot.  Cast / Splint / Dressing X Keep your splint or cast clean and dry.  Dont put anything (coat hanger, pencil, etc) down inside of it.  If it gets damp, use a hair dryer on the cool setting to dry it.  If it gets soaked, call the office to schedule an appointment for a cast change.  After your dressing, cast or splint is removed; you may shower, but do not soak or scrub the wound.  Allow the water to run over it, and then gently pat it dry.  Swelling It is normal for you to have swelling where you had surgery.  To reduce swelling and pain, keep your toes above your nose for at least 3 days after surgery.  It may be necessary to keep your foot or leg elevated for several weeks.  If it hurts, it should be elevated.  Follow Up Call my office at [phone] when you are discharged from the hospital or surgery center to schedule an appointment to be seen two weeks after surgery.  Call my office at [phone] if you develop a fever >101.5 F,  nausea, vomiting, bleeding from the surgical site or severe pain.

## 2014-05-04 ENCOUNTER — Other Ambulatory Visit: Payer: Self-pay | Admitting: *Deleted

## 2014-05-04 LAB — ANAEROBIC CULTURE

## 2014-05-04 MED ORDER — LEVOFLOXACIN 750 MG PO TABS: 750.0000 mg | ORAL_TABLET | Freq: Every day | ORAL | Status: AC

## 2014-05-04 NOTE — Telephone Encounter (Signed)
Spoke with Steve Furr, [facility]  They will be able to fill the prescription for $9. Mother notified, will pick it up today. Andree CossHowell, Nakayla Rorabaugh M, RN

## 2014-05-04 NOTE — Telephone Encounter (Signed)
Thanks

## 2014-06-01 ENCOUNTER — Encounter: Payer: Self-pay | Admitting: Internal Medicine

## 2014-06-01 ENCOUNTER — Ambulatory Visit (INDEPENDENT_AMBULATORY_CARE_PROVIDER_SITE_OTHER): Payer: Self-pay | Admitting: Internal Medicine

## 2014-06-01 VITALS — Temp 98.5°F | Ht 71.0 in | Wt 190.0 lb

## 2014-06-01 DIAGNOSIS — T8131XD Disruption of external operation (surgical) wound, not elsewhere classified, subsequent encounter: Secondary | ICD-10-CM

## 2014-06-01 LAB — COMPREHENSIVE METABOLIC PANEL
ALBUMIN: 4.1 g/dL (ref 3.5–5.2)
ALT: <8 U/L (ref 0–53)
AST: 13 U/L (ref 0–37)
Alkaline Phosphatase: 59 U/L (ref 39–117)
BUN: 11 mg/dL (ref 6–23)
CALCIUM: 9.2 mg/dL (ref 8.4–10.5)
CO2: 30 mEq/L (ref 19–32)
Chloride: 103 mEq/L (ref 96–112)
Creat: 0.93 mg/dL (ref 0.50–1.35)
GLUCOSE: 108 mg/dL — AB (ref 70–99)
POTASSIUM: 3.7 meq/L (ref 3.5–5.3)
SODIUM: 140 meq/L (ref 135–145)
TOTAL PROTEIN: 6.5 g/dL (ref 6.0–8.3)
Total Bilirubin: 0.4 mg/dL (ref 0.2–1.2)
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
## 2014-06-01 NOTE — Assessment & Plan Note (Addendum)
He seems to be healing well and has completed a month of Levaquin. Since there is no significant bone involvement other than being exposed to bone, this is adequate therapy. I will though check his sedimentation rate and cmp. He can follow up on a when necessary basis unless concerns on the labs.

## 2014-06-02 ENCOUNTER — Telehealth: Payer: Self-pay | Admitting: *Deleted

## 2014-06-02 LAB — SEDIMENTATION RATE: Sed Rate: 1 mm/hr (ref 0–16)

## 2014-06-02 NOTE — Telephone Encounter (Signed)
-----   Message from Gardiner Barefootobert W Comer, MD sent at 06/02/2014  1:33 PM EST ----- Please let him know his labs look great, no concerns.  Does not need follow up (as already discussed).  thanks

## 2014-06-02 NOTE — Telephone Encounter (Signed)
Patient notified

## 2014-06-03 ENCOUNTER — Telehealth: Payer: Self-pay | Admitting: *Deleted

## 2014-06-03 ENCOUNTER — Ambulatory Visit: Payer: No Typology Code available for payment source | Attending: Orthopedic Surgery | Admitting: Physical Therapy

## 2014-06-03 DIAGNOSIS — S82891A Other fracture of right lower leg, initial encounter for closed fracture: Secondary | ICD-10-CM

## 2014-06-03 DIAGNOSIS — M25571 Pain in right ankle and joints of right foot: Secondary | ICD-10-CM | POA: Insufficient documentation

## 2014-06-03 DIAGNOSIS — M25671 Stiffness of right ankle, not elsewhere classified: Secondary | ICD-10-CM | POA: Insufficient documentation

## 2014-06-03 DIAGNOSIS — Z9889 Other specified postprocedural states: Secondary | ICD-10-CM | POA: Insufficient documentation

## 2014-06-03 DIAGNOSIS — S82891D Other fracture of right lower leg, subsequent encounter for closed fracture with routine healing: Secondary | ICD-10-CM | POA: Insufficient documentation
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
## 2014-06-03 NOTE — Patient Instructions (Signed)
  ROM: Plantar / Dorsiflexion   With left leg relaxed, gently flex and extend ankle. Move through full range of motion. Avoid pain. Repeat _10-15___ times per set. Do _1___ sets per session. Do _3-5___ sessions per day.  http://orth.exer.us/34   Copyright  VHI. All rights reserved.  Ankle Alphabet   Using left ankle and foot only, trace the letters of the alphabet. Perform A to Z. Repeat _1___ times per set. Do __1__ sets per session. Do __3-5__ sessions per day.  http://orth.exer.us/16   Copyright  VHI. All rights reserved.  Ankle Circles   Slowly rotate right foot and ankle clockwise then counterclockwise. Gradually increase range of motion. Avoid pain. Circle _10-15___ times each direction per set. Do _1___ sets per session. Do __3-5__ sessions per day.  http://orth.exer.us/30   Copyright  VHI. All rights reserved.   Gastroc / Heel Cord Stretch - Seated With Towel   Sit on bed or couch, towel around ball of foot. Gently pull foot in toward body, stretching heel cord and calf. Hold for _30__ seconds.  Repeat _2-3__ times. Do _3-5__ times per day.  Copyright  VHI. All rights reserved.   Clarita CraneStephanie F Talibah Colasurdo, PT, DPT 06/03/2014 4:25 PM  [facility] [address] [place], [place] [address]  [phone] (office) [phone] (fax)

## 2014-06-03 NOTE — Telephone Encounter (Signed)
appts made and printed...td 

## 2016-09-09 ENCOUNTER — Encounter (HOSPITAL_COMMUNITY): Payer: Self-pay | Admitting: Emergency Medicine

## 2016-09-09 ENCOUNTER — Emergency Department (HOSPITAL_COMMUNITY)
Admission: EM | Admit: 2016-09-09 | Discharge: 2016-09-09 | Disposition: A | Discharge status: Home / Self Care | Payer: Self-pay | Attending: Emergency Medicine | Admitting: Emergency Medicine

## 2016-09-09 DIAGNOSIS — K644 Residual hemorrhoidal skin tags: Secondary | ICD-10-CM | POA: Insufficient documentation

## 2016-09-09 DIAGNOSIS — F17210 Nicotine dependence, cigarettes, uncomplicated: Secondary | ICD-10-CM | POA: Insufficient documentation

## 2016-09-09 DIAGNOSIS — J45909 Unspecified asthma, uncomplicated: Secondary | ICD-10-CM | POA: Insufficient documentation

## 2016-09-09 MED ORDER — HYDROCORTISONE ACE-PRAMOXINE 1-1 % RE FOAM: 1.0000 | Freq: Two times a day (BID) | RECTAL | 0 refills | Status: DC

## 2016-09-09 MED ORDER — TRAMADOL HCL 50 MG PO TABS: 50.0000 mg | ORAL_TABLET | Freq: Four times a day (QID) | ORAL | 0 refills | Status: DC | PRN

## 2016-09-09 MED ORDER — DOCUSATE SODIUM 100 MG PO CAPS: 100.0000 mg | ORAL_CAPSULE | Freq: Two times a day (BID) | ORAL | 0 refills | Status: DC

## 2016-09-09 MED ORDER — IBUPROFEN 800 MG PO TABS: 800.0000 mg | ORAL_TABLET | Freq: Three times a day (TID) | ORAL | 0 refills | Status: DC | PRN
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
## 2016-09-09 NOTE — ED Triage Notes (Signed)
Pt c/o rectal pain greater than 2 weeks, per girlfriend, she can see a protrusion near rectum, pt having difficulty sitting in chair.  Pt has noted blood with BM's.

## 2016-09-09 NOTE — Discharge Instructions (Signed)
Use warm bath soaks Epsom salts.  Follow-up with your GI doctor.  Avoid straining when having bowel movements

## 2017-02-16 ENCOUNTER — Emergency Department (HOSPITAL_COMMUNITY)
Admission: EM | Admit: 2017-02-16 | Discharge: 2017-02-20 | Disposition: A | Discharge status: Other Acute Inpatient Hospital | Payer: Self-pay | Attending: Physician Assistant | Admitting: Physician Assistant

## 2017-02-16 ENCOUNTER — Encounter (HOSPITAL_COMMUNITY): Payer: Self-pay

## 2017-02-16 DIAGNOSIS — F69 Unspecified disorder of adult personality and behavior: Secondary | ICD-10-CM

## 2017-02-16 DIAGNOSIS — Z87891 Personal history of nicotine dependence: Secondary | ICD-10-CM | POA: Insufficient documentation

## 2017-02-16 DIAGNOSIS — F99 Mental disorder, not otherwise specified: Secondary | ICD-10-CM

## 2017-02-16 DIAGNOSIS — F29 Unspecified psychosis not due to a substance or known physiological condition: Secondary | ICD-10-CM | POA: Insufficient documentation

## 2017-02-16 DIAGNOSIS — J45909 Unspecified asthma, uncomplicated: Secondary | ICD-10-CM | POA: Insufficient documentation

## 2017-02-16 DIAGNOSIS — Z79899 Other long term (current) drug therapy: Secondary | ICD-10-CM | POA: Insufficient documentation

## 2017-02-16 HISTORY — DX: Bipolar disorder, unspecified: F31.9

## 2017-02-16 LAB — COMPREHENSIVE METABOLIC PANEL
ALBUMIN: 4.5 g/dL (ref 3.5–5.0)
ALT: 18 U/L (ref 17–63)
AST: 26 U/L (ref 15–41)
Alkaline Phosphatase: 83 U/L (ref 38–126)
Anion gap: 9 (ref 5–15)
BUN: 15 mg/dL (ref 6–20)
CHLORIDE: 106 mmol/L (ref 101–111)
CO2: 26 mmol/L (ref 22–32)
Calcium: 9.6 mg/dL (ref 8.9–10.3)
Creatinine, Ser: 1.02 mg/dL (ref 0.61–1.24)
GFR calc Af Amer: >60 mL/min (ref >60)
GFR calc non Af Amer: >60 mL/min (ref >60)
GLUCOSE: 109 mg/dL — AB (ref 65–99)
POTASSIUM: 3.8 mmol/L (ref 3.5–5.1)
SODIUM: 141 mmol/L (ref 135–145)
Total Bilirubin: 1.7 mg/dL — ABNORMAL HIGH (ref 0.3–1.2)
Total Protein: 7.3 g/dL (ref 6.5–8.1)

## 2017-02-16 LAB — RAPID URINE DRUG SCREEN, HOSP PERFORMED
Amphetamines: NOT DETECTED
BARBITURATES: NOT DETECTED
BENZODIAZEPINES: NOT DETECTED
COCAINE: NOT DETECTED
Opiates: NOT DETECTED
Tetrahydrocannabinol: POSITIVE — AB

## 2017-02-16 LAB — SALICYLATE LEVEL: Salicylate Lvl: <7 mg/dL (ref 2.8–30.0)

## 2017-02-16 LAB — CBC
HEMATOCRIT: 39.1 % (ref 39.0–52.0)
Hemoglobin: 13.8 g/dL (ref 13.0–17.0)
MCH: 32.5 pg (ref 26.0–34.0)
MCHC: 35.3 g/dL (ref 30.0–36.0)
MCV: 92.2 fL (ref 78.0–100.0)
Platelets: 341 10*3/uL (ref 150–400)
RBC: 4.24 MIL/uL (ref 4.22–5.81)
RDW: 12.4 % (ref 11.5–15.5)
WBC: 5.9 10*3/uL (ref 4.0–10.5)

## 2017-02-16 LAB — ACETAMINOPHEN LEVEL: Acetaminophen (Tylenol), Serum: <10 ug/mL — ABNORMAL LOW (ref 10–30)

## 2017-02-16 LAB — ETHANOL: Alcohol, Ethyl (B): <10 mg/dL (ref <10)

## 2017-02-16 MED ORDER — ONDANSETRON HCL 4 MG PO TABS
4.0000 mg | ORAL_TABLET | Freq: Three times a day (TID) | ORAL | Status: DC | PRN
Start: 2017-02-16 — End: 2017-02-20

## 2017-02-16 MED ORDER — IBUPROFEN 400 MG PO TABS: 600.0000 mg | ORAL_TABLET | Freq: Three times a day (TID) | ORAL | Status: DC | PRN
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
## 2017-02-16 NOTE — ED Notes (Signed)
Dr Corlis Leak completed 1st exam - original placed in folder for Magistrate, copy placed w/each IVC paperwork (3 sets) - on clipboard, copy faxed to [facility], and copy sent to Medical Records.

## 2017-02-16 NOTE — ED Notes (Signed)
Pharm Tech in w/pt. 

## 2017-02-16 NOTE — ED Notes (Signed)
Dr Mackuen in w/pt.  
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
## 2017-02-16 NOTE — ED Notes (Signed)
Pt ambulatory to F7 w/RN - pt wearing burgundy scrubs - lunch ordered for pt. Sprite given as requested. Pt aware of need for urine specimen. Pt verbalized understanding and signed Medical Clearance Pt Policy form - copy given to pt.

## 2017-02-16 NOTE — ED Triage Notes (Signed)
Pt here with police, pt is IVC due to hallucinations. Pt is bipolar and not compliant with meds. Family had pt IVC'd for his behavior. Pt has been sleeping with a Designer, jewellery and having visual hallucinations. Pt is Axox4.

## 2017-02-16 NOTE — ED Notes (Signed)
Sitter arrived

## 2017-02-16 NOTE — ED Notes (Signed)
Lunch order placed

## 2017-02-16 NOTE — ED Notes (Signed)
Patient is anxious and is asking the sitter how long this process takes.  This RN informed the patient that there was no definite plan since the psych counselors just talked to him. Patient is paranoid about his kids and is asking if he can leave.  This RN explained that these types of admissions take time and that they must make sure everyone involved is safe, including him, before he leaves.  This RN explained that usually patients stay overnight and then will talk to doctors again in the morning after getting sleep and food.  Patient still anxious, but attempting deep breathing.  This RN told patient that I will tell him when I get a plan from [facility].

## 2017-02-16 NOTE — ED Notes (Signed)
Pt states he is unable to void at this time.  Aware of need for urine specimen.
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
## 2017-02-17 MED ORDER — STERILE WATER FOR INJECTION IJ SOLN
INTRAMUSCULAR | Status: AC
  Filled 2017-02-17: qty 10

## 2017-02-17 MED ORDER — STERILE WATER FOR INJECTION IJ SOLN
2.1000 mL | Freq: Once | INTRAMUSCULAR | Status: DC
  Administered 2017-02-18: 1.2 mL via INTRAMUSCULAR

## 2017-02-17 MED ORDER — IBUPROFEN 200 MG PO TABS: 600.0000 mg | ORAL_TABLET | Freq: Three times a day (TID) | ORAL | Status: DC | PRN

## 2017-02-17 MED ORDER — ZOLPIDEM TARTRATE 5 MG PO TABS
5.0000 mg | ORAL_TABLET | Freq: Every evening | ORAL | Status: DC | PRN
  Administered 2017-02-19: 5 mg via ORAL
  Filled 2017-02-17: qty 1

## 2017-02-17 MED ORDER — NICOTINE 21 MG/24HR TD PT24: 21.0000 mg | MEDICATED_PATCH | Freq: Every day | TRANSDERMAL | Status: DC | PRN

## 2017-02-17 MED ORDER — ONDANSETRON HCL 4 MG PO TABS: 4.0000 mg | ORAL_TABLET | Freq: Three times a day (TID) | ORAL | Status: DC | PRN

## 2017-02-17 MED ORDER — ZIPRASIDONE MESYLATE 20 MG IM SOLR
10.0000 mg | Freq: Once | INTRAMUSCULAR | Status: DC
  Filled 2017-02-17: qty 20

## 2017-02-17 MED ORDER — OLANZAPINE 5 MG PO TBDP
5.0000 mg | ORAL_TABLET | Freq: Once | ORAL | Status: DC
  Administered 2017-02-17: 5 mg via ORAL
  Filled 2017-02-17: qty 1

## 2017-02-17 MED ORDER — ALUM & MAG HYDROXIDE-SIMETH 200-200-20 MG/5ML PO SUSP: 30.0000 mL | Freq: Four times a day (QID) | ORAL | Status: DC | PRN

## 2017-02-17 NOTE — ED Notes (Addendum)
Pt noted to be pacing in room, huffing loudly - stating he is going to leave. Advised pt he is under IVC - pt noted to be upset - states "She always does this to me and I ain't done nothing". Dr Erma Heritage aware - order received Zyprexa. Security standing by.

## 2017-02-17 NOTE — ED Notes (Signed)
After getting Geodon inj ready, pt agreed to take Zyprexa. Security continues to stand by.
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
## 2017-02-17 NOTE — ED Notes (Signed)
Pt encouraged to take Zyprexa disintegrating tablet - pt continued to refuse. Dr Erma Heritage aware.

## 2017-02-17 NOTE — ED Notes (Signed)
Re-TTS being performed.  

## 2017-02-17 NOTE — ED Notes (Signed)
Woke pt so may eat dinner. Pt noted to be calm, cooperative.

## 2017-02-17 NOTE — ED Notes (Signed)
Pt noted to be much calmer at this time. Pt asked to use phone so may call someone to come visit him at 1730. Pt at nurses' desk attempting to make call.

## 2017-02-18 MED ORDER — STERILE WATER FOR INJECTION IJ SOLN
INTRAMUSCULAR | Status: AC
  Administered 2017-02-18: 1.2 mL via INTRAMUSCULAR
  Filled 2017-02-18: qty 10

## 2017-02-18 MED ORDER — ZIPRASIDONE MESYLATE 20 MG IM SOLR
10.0000 mg | Freq: Once | INTRAMUSCULAR | Status: AC
  Administered 2017-02-18: 10 mg via INTRAMUSCULAR
  Filled 2017-02-18: qty 20

## 2017-02-18 NOTE — ED Notes (Signed)
Patient was given [place] Cloud and Peanut Butter and Sprite.

## 2017-02-18 NOTE — ED Notes (Signed)
PT ambulates to shower with sitter.
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
## 2017-02-18 NOTE — ED Notes (Signed)
Regular Diet was ordered for Dinner. 

## 2017-02-18 NOTE — Progress Notes (Signed)
Patient assessed by TTS today and continues to exhibit symptoms of psychosis. The patient has been very delusional. Case discussed with Dr. Lucianne Muss. Would recommend the addition of Zyprexa Zydis 5 mg po at hs due to recent agitation and continued psychosis.

## 2017-02-18 NOTE — ED Notes (Signed)
Patient was given a snack and drink. 

## 2017-02-18 NOTE — ED Notes (Signed)
Called to pt room where he state he wants to leave. Explained to pt that he was IVC and process explained. Pt becoming somewhat agitated , slams door and cursing at staff. Security called and Dr. Erma Heritage noitified.

## 2017-02-18 NOTE — ED Notes (Signed)
Discussed situation for patient at length. Pt takes medication voluntarily. Sitting on bed and no longer cursing staff.

## 2017-02-19 DIAGNOSIS — F250 Schizoaffective disorder, bipolar type: Secondary | ICD-10-CM | POA: Diagnosis present

## 2017-02-19 MED ORDER — OLANZAPINE 5 MG PO TBDP
5.0000 mg | ORAL_TABLET | Freq: Every day | ORAL | Status: DC
  Administered 2017-02-19: 5 mg via ORAL
  Filled 2017-02-19: qty 1

## 2017-02-19 MED ORDER — ALUM & MAG HYDROXIDE-SIMETH 200-200-20 MG/5ML PO SUSP: 30.0000 mL | ORAL | Status: DC | PRN

## 2017-02-19 MED ORDER — OLANZAPINE 5 MG PO TABS
15.0000 mg | ORAL_TABLET | Freq: Every day | ORAL | Status: DC
  Administered 2017-02-20 – 2017-02-21 (×3): 15 mg via ORAL
  Filled 2017-02-19 (×2): qty 1

## 2017-02-19 MED ORDER — MAGNESIUM HYDROXIDE 400 MG/5ML PO SUSP: 30.0000 mL | Freq: Every day | ORAL | Status: DC | PRN

## 2017-02-19 MED ORDER — TRAZODONE HCL 100 MG PO TABS: 100.0000 mg | ORAL_TABLET | Freq: Every evening | ORAL | Status: DC | PRN

## 2017-02-19 MED ORDER — HYDROXYZINE HCL 25 MG PO TABS: 25.0000 mg | ORAL_TABLET | Freq: Three times a day (TID) | ORAL | Status: DC | PRN

## 2017-02-19 MED ORDER — LORAZEPAM 2 MG PO TABS: 2.0000 mg | ORAL_TABLET | Freq: Four times a day (QID) | ORAL | Status: DC | PRN

## 2017-02-19 MED ORDER — NICOTINE 21 MG/24HR TD PT24
21.0000 mg | MEDICATED_PATCH | Freq: Every day | TRANSDERMAL | Status: DC
  Filled 2017-02-19: qty 1

## 2017-02-19 MED ORDER — ACETAMINOPHEN 325 MG PO TABS: 650.0000 mg | ORAL_TABLET | Freq: Four times a day (QID) | ORAL | Status: DC | PRN

## 2017-02-19 NOTE — Progress Notes (Signed)
Pt accepted at [facility] Room 309 per Gardiner Coins.  Pt is IVC'd and must be transferred via law enforcement.  Pt may be transported for admission on 02/20/17 in the AM, as [place] Sheriff[employer] Dept does not transport after 5 PM.  Carney Bern T. Kaylyn Lim, MSW, LCSWA Disposition Clinical Social Work [phone] (cell) [phone] (office)

## 2017-02-19 NOTE — ED Notes (Signed)
Pt does not like asparagus - macaroni 'n cheese ordered for pt.

## 2017-02-19 NOTE — BH Assessment (Signed)
Patient has been accepted to [facility].  Accepting physician is Dr. Loney Laurence.  Attending Physician will be Dr. Jennet Maduro.  Patient has been assigned to room 309, by [facility] Charge Nurse T'Ywan.  Call report to [phone].  Representative/Transfer Coordinator is Warden/ranger Patient pre-admitted by [facility] Patient Access Mertie Clause)  [facility] Staff Carney Bern, Jerilynn Som) made aware of acceptance.

## 2017-02-19 NOTE — ED Notes (Signed)
Copy of IVC papers faxed to [facility] as requested.

## 2017-02-19 NOTE — ED Notes (Signed)
Pt ambulatory to shower w/Sitter. 

## 2017-02-19 NOTE — ED Notes (Signed)
States "I hope I get to go today. I need to go. My birthday is soon and I don't need to be in here". Pt denies SI/HI. Pt noted to be calm, cooperative at this time.

## 2017-02-19 NOTE — ED Notes (Signed)
Snack given as requested.  

## 2017-02-20 ENCOUNTER — Inpatient Hospital Stay
Admission: AD | Admit: 2017-02-20 | Discharge: 2017-02-22 | DRG: 885 | Disposition: A | Discharge status: Home / Self Care | Payer: No Typology Code available for payment source | Attending: Psychiatry | Admitting: Psychiatry

## 2017-02-20 DIAGNOSIS — R001 Bradycardia, unspecified: Secondary | ICD-10-CM | POA: Diagnosis present

## 2017-02-20 DIAGNOSIS — F250 Schizoaffective disorder, bipolar type: Secondary | ICD-10-CM | POA: Diagnosis present

## 2017-02-20 DIAGNOSIS — F17210 Nicotine dependence, cigarettes, uncomplicated: Secondary | ICD-10-CM | POA: Diagnosis present

## 2017-02-20 DIAGNOSIS — G4700 Insomnia, unspecified: Secondary | ICD-10-CM | POA: Diagnosis present

## 2017-02-20 DIAGNOSIS — J45909 Unspecified asthma, uncomplicated: Secondary | ICD-10-CM | POA: Diagnosis present

## 2017-02-20 LAB — LIPID PANEL
CHOLESTEROL: 108 mg/dL (ref 0–200)
HDL: 48 mg/dL (ref >40)
LDL Cholesterol: 53 mg/dL (ref 0–99)
Total CHOL/HDL Ratio: 2.3 RATIO
Triglycerides: 35 mg/dL (ref <150)
VLDL: 7 mg/dL (ref 0–40)

## 2017-02-20 LAB — TSH: TSH: 1.546 u[IU]/mL (ref 0.350–4.500)

## 2017-02-20 LAB — HEMOGLOBIN A1C
HEMOGLOBIN A1C: 5.1 % (ref 4.8–5.6)
MEAN PLASMA GLUCOSE: 99.67 mg/dL
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
## 2017-02-20 NOTE — ED Notes (Signed)
Pt ate all of his breakfast. Patient informed hes been accepted to [facility] unit, pt agitated, stating "this is bullshit, I'm ready to go home". Pt given option to call family to update, pt has not done so at this time. Pt went back to sleep.

## 2017-02-20 NOTE — Progress Notes (Signed)
Patient states "I need to go home my birthday is coming."Patient was hesitant for the body search.Cooperated with security.Denies suicidal or homicidal ideations and AV hallucinations.No contraband found.Oriented to unit and made comfortable in room.

## 2017-02-20 NOTE — ED Notes (Signed)
Pt offered wheelchair to vehicle. Pt stated to sitter "Do I look retarded? I can walk"

## 2017-02-20 NOTE — Tx Team (Signed)
Initial Treatment Plan 02/20/2017 3:31 PM [name] ZOX:[id number]    PATIENT STRESSORS: Marital or family conflict Medication change or noncompliance   PATIENT STRENGTHS: Average or above average intelligence Communication skills Supportive family/friends Work skills   PATIENT IDENTIFIED PROBLEMS: Hallucinations 02/20/2017  Suicide thoughts 02/20/2017                   DISCHARGE CRITERIA:  Adequate post-discharge living arrangements Medical problems require only outpatient monitoring Motivation to continue treatment in a less acute level of care  PRELIMINARY DISCHARGE PLAN: Attend aftercare/continuing care group Return to previous living arrangement  PATIENT/FAMILY INVOLVEMENT: This treatment plan has been presented to and reviewed with the patient, [name], and/or family member,   The patient and family have been given the opportunity to ask questions and make suggestions.  Leonarda Salon, RN 02/20/2017, 3:31 PM

## 2017-02-20 NOTE — ED Notes (Signed)
[place] sherriff here to take patient. Belongings, valuables, IVC paperwork given to officer. Pt refused to contact family to let them know where he was going.

## 2017-02-21 MED ORDER — OLANZAPINE 15 MG PO TABS: 15.0000 mg | ORAL_TABLET | Freq: Every day | ORAL | 1 refills | Status: DC
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
## 2017-02-21 NOTE — Progress Notes (Signed)
Patient is calm and cooperative, patient is friendly, He talks sensible, but He did start to talk about nature , and how He can get along well with the wild animals, especially the foxes, states that He has always had that ability since he was small to be friends with the wild. Patient is safe, denies Si/hi or avh.

## 2017-02-21 NOTE — Progress Notes (Signed)
Patient is calm and cooperative, good appetite, no behavioral issues, q 15 minute checks in progress.
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
## 2017-02-21 NOTE — BHH Group Notes (Signed)
LCSW Group Therapy Note  02/21/2017 1:15pm  Type of Therapy/Topic:  Group Therapy:  Balance in Life  Participation Level:  Did Not Attend, remained in room  Description of Group:    This group will address the concept of balance and how it feels and looks when one is unbalanced. Patients will be encouraged to process areas in their lives that are out of balance and identify reasons for remaining unbalanced. Facilitators will guide patients in utilizing problem-solving interventions to address and correct the stressor making their life unbalanced. Understanding and applying boundaries will be explored and addressed for obtaining and maintaining a balanced life. Patients will be encouraged to explore ways to assertively make their unbalanced needs known to significant others in their lives, using other group members and facilitator for support and feedback.  Therapeutic Goals: 1. Patient will identify two or more emotions or situations they have that consume much of in their lives. 2. Patient will identify signs/triggers that life has become out of balance:  3. Patient will identify two ways to set boundaries in order to achieve balance in their lives:  4. Patient will demonstrate ability to communicate their needs through discussion and/or role plays  Summary of Patient Progress:      Therapeutic Modalities:   Cognitive Behavioral Therapy Solution-Focused Therapy Assertiveness Training  Sallee Lange, LCSW 02/21/2017 4:15 PM

## 2017-02-21 NOTE — BHH Group Notes (Signed)
B[facility] Group Notes:  (Nursing/MHT/Case Management/Adjunct)  Date:  02/21/2017  Time:  9:43 PM  Type of Therapy:  Group Therapy  Participation Level:  Did Not Attend  Participation Qualit Summary of Progress/Problems:  Mayra Neer 02/21/2017, 9:43 PM
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
## 2017-02-21 NOTE — Plan of Care (Signed)
Problem: Activity: Goal: Sleeping patterns will improve Outcome: Progressing Patient slept for Estimated Hours of 7; Precautionary checks every 15 minutes for safety maintained, room free of safety hazards, patient sustains no injury or falls during this shift.    

## 2017-02-22 NOTE — Progress Notes (Signed)
CSW spoke to [name], pt mother.  She was able to visit last night, pt did seem better.  They did discuss medication and pt is agreeing to take it moving forward.  Discussed [facility] helping with supply of meds and that pt will be given 7 days medication upon discharge.  Mother asked that pt take PART bus home and [place] bus [place]. Garner Nash, MSW, LCSW Clinical Social Worker 02/22/2017 10:15 AM

## 2017-02-22 NOTE — Progress Notes (Signed)
Discharge note:  Patient discharged home per MD order.  Patient will follow up with [facility] for medication management.  Patient denies any thoughts of self harm.  He received all personal belongings from unit and locker. Reviewed AVS/transition record with patient and he indicated understanding.  Patient left with 7 day supply of medication and prescription. Patient left ambulatory with his mother.

## 2017-02-22 NOTE — Plan of Care (Signed)
Problem: Education: Goal: Emotional status will improve Outcome: Progressing Patient is participating in group and assertive with peers   Problem: Coping: Goal: Ability to verbalize frustrations and anger appropriately will improve Outcome: Progressing Patient is aware of disease processes and treatment modalities
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
## 2017-02-22 NOTE — Progress Notes (Signed)
  [facility] Adult Case Management Discharge Plan :  Will you be returning to the same living situation after discharge:  Yes,  with mother At discharge, do you have transportation home?: No.PT will ride the [facility] bus. Do you have the ability to pay for your medications: No. Will utilize [employer].  Release of information consent forms completed and in the chart;  Patient's signature needed at discharge.  Patient to Follow up at: Follow-up Information    [facility]. Go on 02/26/2017.   Specialty:  Behavioral Health Why:  Please attend your follow up appointment at [facility] on Tuesday, 02/26/17, at 12:30pm.  Please bring a copy of your hospital discharge paperwork. Contact information: [address] [place] [address] [phone]           Next level of care provider has access to [facility] Link:no  Safety Planning and Suicide Prevention discussed: Yes,  with mother  Have you used any form of tobacco in the last 30 days? (Cigarettes, Smokeless Tobacco, Cigars, and/or Pipes): Yes  Has patient been referred to the Quitline?: Patient refused referral  Patient has been referred for addiction treatment: Yes  Lorri Frederick, LCSW 02/22/2017, 10:15 AM

## 2017-02-22 NOTE — Tx Team (Signed)
Interdisciplinary Treatment and Diagnostic Plan Update  02/22/2017 Time of Session: 1030 [name] MRN: [id number]  Principal Diagnosis: Schizoaffective disorder, bipolar type [facility])  Secondary Diagnoses: Principal Problem:   Schizoaffective disorder, bipolar type (HCC)   Current Medications:  Current Facility-Administered Medications  Medication Dose Route Frequency Provider Last Rate Last Dose  . acetaminophen (TYLENOL) tablet 650 mg  650 mg Oral Q6H PRN Pucilowska, Jolanta B, MD      . alum & mag hydroxide-simeth (MAALOX/MYLANTA) 200-200-20 MG/5ML suspension 30 mL  30 mL Oral Q4H PRN Pucilowska, Jolanta B, MD      . magnesium hydroxide (MILK OF MAGNESIA) suspension 30 mL  30 mL Oral Daily PRN Pucilowska, Jolanta B, MD      . nicotine (NICODERM CQ - dosed in mg/24 hours) patch 21 mg  21 mg Transdermal Q0600 Pucilowska, Jolanta B, MD      . OLANZapine (ZYPREXA) tablet 15 mg  15 mg Oral QHS Pucilowska, Jolanta B, MD   15 mg at 02/21/17 2124   PTA Medications: Prescriptions Prior to Admission  Medication Sig Dispense Refill Last Dose  . acetaminophen (TYLENOL) 500 MG tablet Take 500 mg by mouth every 6 (six) hours as needed for headache (pain).   couple days ago  . docusate sodium (COLACE) 100 MG capsule Take 1 capsule (100 mg total) by mouth every 12 (twelve) hours. (Patient not taking: Reported on 02/16/2017) 60 capsule 0 Not Taking at Unknown time  . HYDROcodone-acetaminophen (NORCO) 5-325 MG per tablet Take 1 tablet by mouth every 6 (six) hours as needed for moderate pain. (Patient not taking: Reported on 02/16/2017) 20 tablet 0 Not Taking at Unknown time  . hydrocortisone-pramoxine (PROCTOFOAM HC) rectal foam Place 1 applicator rectally 2 (two) times daily. (Patient not taking: Reported on 02/16/2017) 10 g 0 Not Taking at Unknown time  . ibuprofen (ADVIL,MOTRIN) 200 MG tablet Take 200 mg by mouth every 6 (six) hours as needed for headache (pain).   couple months ago  . ibuprofen  (ADVIL,MOTRIN) 800 MG tablet Take 1 tablet (800 mg total) by mouth every 8 (eight) hours as needed. (Patient not taking: Reported on 02/16/2017) 21 tablet 0 Not Taking at Unknown time  . Multiple Vitamin (MULTIVITAMIN WITH MINERALS) TABS tablet Take 1 tablet by mouth daily.   few weeks ago  . traMADol (ULTRAM) 50 MG tablet Take 1 tablet (50 mg total) by mouth every 6 (six) hours as needed for severe pain. (Patient not taking: Reported on 02/16/2017) 15 tablet 0 Not Taking at Unknown time  . Trolamine Salicylate (MYOFLEX EX) Apply 1 application topically daily as needed (pain).   few weeks ago    Patient Stressors: Marital or family conflict Medication change or noncompliance  Patient Strengths: Average or above average intelligence Communication skills Supportive family/friends Work skills  Treatment Modalities: Medication Management, Group therapy, Case management,  1 to 1 session with clinician, Psychoeducation, Recreational therapy.   Physician Treatment Plan for Primary Diagnosis: Schizoaffective disorder, bipolar type (HCC) Long Term Goal(s): Improvement in symptoms so as ready for discharge Improvement in symptoms so as ready for discharge   Short Term Goals: Ability to identify changes in lifestyle to reduce recurrence of condition will improve Ability to verbalize feelings will improve Ability to disclose and discuss suicidal ideas Ability to demonstrate self-control will improve Compliance with prescribed medications will improve Ability to identify triggers associated with substance abuse/mental health issues will improve Ability to identify changes in lifestyle to reduce recurrence of condition will improve  Ability to demonstrate self-control will improve Ability to identify triggers associated with substance abuse/mental health issues will improve  Medication Management: Evaluate patient's response, side effects, and tolerance of medication regimen.  Therapeutic  Interventions: 1 to 1 sessions, Unit Group sessions and Medication administration.  Evaluation of Outcomes: Adequate for Discharge  Physician Treatment Plan for Secondary Diagnosis: Principal Problem:   Schizoaffective disorder, bipolar type (HCC)  Long Term Goal(s): Improvement in symptoms so as ready for discharge Improvement in symptoms so as ready for discharge   Short Term Goals: Ability to identify changes in lifestyle to reduce recurrence of condition will improve Ability to verbalize feelings will improve Ability to disclose and discuss suicidal ideas Ability to demonstrate self-control will improve Compliance with prescribed medications will improve Ability to identify triggers associated with substance abuse/mental health issues will improve Ability to identify changes in lifestyle to reduce recurrence of condition will improve Ability to demonstrate self-control will improve Ability to identify triggers associated with substance abuse/mental health issues will improve     Medication Management: Evaluate patient's response, side effects, and tolerance of medication regimen.  Therapeutic Interventions: 1 to 1 sessions, Unit Group sessions and Medication administration.  Evaluation of Outcomes: Adequate for Discharge   RN Treatment Plan for Primary Diagnosis: Schizoaffective disorder, bipolar type (HCC) Long Term Goal(s): Knowledge of disease and therapeutic regimen to maintain health will improve  Short Term Goals: Ability to identify and develop effective coping behaviors will improve and Compliance with prescribed medications will improve  Medication Management: RN will administer medications as ordered by provider, will assess and evaluate patient's response and provide education to patient for prescribed medication. RN will report any adverse and/or side effects to prescribing provider.  Therapeutic Interventions: 1 on 1 counseling sessions, Psychoeducation, Medication  administration, Evaluate responses to treatment, Monitor vital signs and CBGs as ordered, Perform/monitor CIWA, COWS, AIMS and Fall Risk screenings as ordered, Perform wound care treatments as ordered.  Evaluation of Outcomes: Adequate for Discharge   LCSW Treatment Plan for Primary Diagnosis: Schizoaffective disorder, bipolar type (HCC) Long Term Goal(s): Safe transition to appropriate next level of care at discharge, Engage patient in therapeutic group addressing interpersonal concerns.  Short Term Goals: Engage patient in aftercare planning with referrals and resources and Increase skills for wellness and recovery  Therapeutic Interventions: Assess for all discharge needs, 1 to 1 time with Social worker, Explore available resources and support systems, Assess for adequacy in community support network, Educate family and significant other(s) on suicide prevention, Complete Psychosocial Assessment, Interpersonal group therapy.  Evaluation of Outcomes: Adequate for Discharge   Progress in Treatment: Attending groups: Yes. Participating in groups: Yes. Taking medication as prescribed: Yes. Toleration medication: Yes. Family/Significant other contact made: Yes, individual(s) contacted:  mother Patient understands diagnosis: Yes. Discussing patient identified problems/goals with staff: Yes. Medical problems stabilized or resolved: Yes. Denies suicidal/homicidal ideation: Yes. Issues/concerns per patient self-inventory: No. Other: none  New problem(s) identified: No, Describe:  none  New Short Term/Long Term Goal(s):Pt goal: discharge today.  Discharge Plan or Barriers: PT will follow up at [facility]  Reason for Continuation of Hospitalization: Other; describe none, discharge today.  Estimated Length of Stay:discharge today.  Attendees: Patient: [name] 02/22/2017   Physician: Dr. Jennet Maduro, MD 02/22/2017   Nursing: Hulan Amato, RN 02/22/2017   RN Care Manager: 02/22/2017    Social Worker: Daleen Squibb, LCSW 02/22/2017   Recreational Therapist: Garret Reddish, CTRS, LRT 02/22/2017   Other:  02/22/2017   Other:  02/22/2017  Other: 02/22/2017        Scribe for Treatment Team: Lorri Frederick, LCSW 02/22/2017 2:14 PM
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
## 2017-02-22 NOTE — [facility] Group Notes (Signed)
[facility] LCSW Group Therapy Note  Date/Time: 02/22/17, 1300  Type of Therapy and Topic:  Group Therapy:  Feelings around Relapse and Recovery  Participation Level:  Active   Mood:pleasant  Description of Group:    Patients in this group will discuss emotions they experience before and after a relapse. They will process how experiencing these feelings, or avoidance of experiencing them, relates to having a relapse. Facilitator will guide patients to explore emotions they have related to recovery. Patients will be encouraged to process which emotions are more powerful. They will be guided to discuss the emotional reaction significant others in their lives may have to patients' relapse or recovery. Patients will be assisted in exploring ways to respond to the emotions of others without this contributing to a relapse.  Therapeutic Goals: 1. Patient will identify two or more emotions that lead to relapse for them:  2. Patient will identify two emotions that result when they relapse:  3. Patient will identify two emotions related to recovery:  4. Patient will demonstrate ability to communicate their needs through discussion and/or role plays.   Summary of Patient Progress: Pt shared that anger is a difficult emotion for him.  Pt did not share any specific substacne abuse history but did participate in the discussion about relapse and addiction.  Made several comments.     Therapeutic Modalities:   Cognitive Behavioral Therapy Solution-Focused Therapy Assertiveness Training Relapse Prevention Therapy  Daleen Squibb, LCSW

## 2017-12-21 ENCOUNTER — Encounter (HOSPITAL_COMMUNITY): Payer: Self-pay | Admitting: Emergency Medicine

## 2017-12-21 ENCOUNTER — Emergency Department (HOSPITAL_COMMUNITY)
Admission: EM | Admit: 2017-12-21 | Discharge: 2017-12-22 | Disposition: A | Discharge status: Home / Self Care | Payer: Self-pay | Attending: Emergency Medicine | Admitting: Emergency Medicine

## 2017-12-21 DIAGNOSIS — F17210 Nicotine dependence, cigarettes, uncomplicated: Secondary | ICD-10-CM | POA: Insufficient documentation

## 2017-12-21 DIAGNOSIS — Z79899 Other long term (current) drug therapy: Secondary | ICD-10-CM | POA: Insufficient documentation

## 2017-12-21 DIAGNOSIS — F319 Bipolar disorder, unspecified: Secondary | ICD-10-CM | POA: Insufficient documentation

## 2017-12-21 DIAGNOSIS — F259 Schizoaffective disorder, unspecified: Secondary | ICD-10-CM | POA: Insufficient documentation

## 2017-12-21 DIAGNOSIS — L0231 Cutaneous abscess of buttock: Secondary | ICD-10-CM | POA: Insufficient documentation

## 2017-12-21 DIAGNOSIS — L0291 Cutaneous abscess, unspecified: Secondary | ICD-10-CM

## 2017-12-21 DIAGNOSIS — J45909 Unspecified asthma, uncomplicated: Secondary | ICD-10-CM | POA: Insufficient documentation

## 2017-12-21 MED ORDER — HYDROMORPHONE HCL 1 MG/ML IJ SOLN
1.0000 mg | Freq: Once | INTRAMUSCULAR | Status: AC
  Administered 2017-12-22: 1 mg via INTRAVENOUS
  Filled 2017-12-21: qty 1

## 2017-12-21 MED ORDER — ONDANSETRON HCL 4 MG/2ML IJ SOLN
4.0000 mg | Freq: Once | INTRAMUSCULAR | Status: AC
  Administered 2017-12-22: 4 mg via INTRAVENOUS
  Filled 2017-12-21: qty 2

## 2017-12-21 NOTE — ED Triage Notes (Signed)
Reports boil on left buttock for the last week.  Took ibuprofen at 7pm.

## 2017-12-22 MED ORDER — HYDROCODONE-ACETAMINOPHEN 5-325 MG PO TABS: 2.0000 | ORAL_TABLET | ORAL | 0 refills | Status: DC | PRN

## 2017-12-22 MED ORDER — SULFAMETHOXAZOLE-TRIMETHOPRIM 800-160 MG PO TABS: 1.0000 | ORAL_TABLET | Freq: Two times a day (BID) | ORAL | 0 refills | Status: AC

## 2017-12-22 MED ORDER — LIDOCAINE HCL (PF) 1 % IJ SOLN
30.0000 mL | Freq: Once | INTRAMUSCULAR | Status: AC
  Administered 2017-12-22: 30 mL
  Filled 2017-12-22: qty 30

## 2017-12-22 NOTE — Discharge Instructions (Signed)
Recheck at Urgent care in 2 days  
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
## 2018-03-01 ENCOUNTER — Encounter (HOSPITAL_COMMUNITY): Payer: Self-pay

## 2018-03-01 ENCOUNTER — Emergency Department (HOSPITAL_COMMUNITY)
Admission: EM | Admit: 2018-03-01 | Discharge: 2018-03-01 | Disposition: A | Discharge status: Home / Self Care | Payer: Self-pay | Attending: Emergency Medicine | Admitting: Emergency Medicine

## 2018-03-01 ENCOUNTER — Other Ambulatory Visit: Payer: Self-pay

## 2018-03-01 DIAGNOSIS — F250 Schizoaffective disorder, bipolar type: Secondary | ICD-10-CM | POA: Insufficient documentation

## 2018-03-01 DIAGNOSIS — F17210 Nicotine dependence, cigarettes, uncomplicated: Secondary | ICD-10-CM | POA: Insufficient documentation

## 2018-03-01 DIAGNOSIS — M5412 Radiculopathy, cervical region: Secondary | ICD-10-CM | POA: Insufficient documentation

## 2018-03-01 MED ORDER — PREDNISONE 20 MG PO TABS: 40.0000 mg | ORAL_TABLET | Freq: Every day | ORAL | 0 refills | Status: AC

## 2018-03-01 MED ORDER — IBUPROFEN 600 MG PO TABS: 600.0000 mg | ORAL_TABLET | Freq: Three times a day (TID) | ORAL | 0 refills | Status: DC | PRN
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
## 2018-03-01 NOTE — ED Triage Notes (Addendum)
Pt BIBA from home. Pt is concerned that he has bed bugs. Pt states that he has been bitten in his legs. Pt also states that he has numbness in his left arm for 3 weeks. Pt states he has had a crawling sensation all over. Denies ETOH use. Pt states he feels itchy, and is concerned that it may be "voodoo".

## 2018-03-01 NOTE — ED Notes (Signed)
Bed: WLPT3 Expected date:  Expected time:  Means of arrival:  Comments: 

## 2018-04-08 ENCOUNTER — Emergency Department (HOSPITAL_COMMUNITY): Payer: Self-pay

## 2018-04-08 ENCOUNTER — Encounter (HOSPITAL_COMMUNITY): Payer: Self-pay | Admitting: Emergency Medicine

## 2018-04-08 ENCOUNTER — Emergency Department (HOSPITAL_COMMUNITY)
Admission: EM | Admit: 2018-04-08 | Discharge: 2018-04-08 | Disposition: A | Discharge status: Home / Self Care | Payer: Self-pay | Attending: Emergency Medicine | Admitting: Emergency Medicine

## 2018-04-08 DIAGNOSIS — J45909 Unspecified asthma, uncomplicated: Secondary | ICD-10-CM | POA: Insufficient documentation

## 2018-04-08 DIAGNOSIS — Z79899 Other long term (current) drug therapy: Secondary | ICD-10-CM | POA: Insufficient documentation

## 2018-04-08 DIAGNOSIS — F17210 Nicotine dependence, cigarettes, uncomplicated: Secondary | ICD-10-CM | POA: Insufficient documentation

## 2018-04-08 DIAGNOSIS — R05 Cough: Secondary | ICD-10-CM | POA: Insufficient documentation

## 2018-04-08 DIAGNOSIS — R059 Cough, unspecified: Secondary | ICD-10-CM

## 2018-04-08 MED ORDER — AZITHROMYCIN 250 MG PO TABS: ORAL_TABLET | ORAL | 0 refills | Status: DC
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
## 2018-04-08 NOTE — ED Triage Notes (Signed)
Pt reports productive cough with dark sputum, sore throat for several days. Anxious, upset in triage.

## 2018-04-08 NOTE — Discharge Instructions (Addendum)
Contact a health care provider if: You have new symptoms. You cough up pus. Your cough does not get better after 2-3 weeks, or your cough gets worse. You cannot control your cough with suppressant medicines and you are losing sleep. You develop pain that is getting worse or pain that is not controlled with pain medicines. You have a fever. You have unexplained weight loss. You have night sweats. Get help right away if: You cough up blood or blood clots. You have difficulty breathing. Your heartbeat is very fast

## 2018-04-08 NOTE — ED Notes (Signed)
Pt ambulated in the hallways without SOB or diff breathing.  Sats remained at 100% RA

## 2018-06-21 ENCOUNTER — Emergency Department (HOSPITAL_COMMUNITY)
Admission: EM | Admit: 2018-06-21 | Discharge: 2018-06-24 | Disposition: A | Discharge status: Home / Self Care | Payer: Self-pay | Attending: Emergency Medicine | Admitting: Emergency Medicine

## 2018-06-21 ENCOUNTER — Other Ambulatory Visit: Payer: Self-pay

## 2018-06-21 ENCOUNTER — Encounter (HOSPITAL_COMMUNITY): Payer: Self-pay

## 2018-06-21 DIAGNOSIS — F17210 Nicotine dependence, cigarettes, uncomplicated: Secondary | ICD-10-CM | POA: Insufficient documentation

## 2018-06-21 DIAGNOSIS — Z79899 Other long term (current) drug therapy: Secondary | ICD-10-CM | POA: Insufficient documentation

## 2018-06-21 DIAGNOSIS — J45909 Unspecified asthma, uncomplicated: Secondary | ICD-10-CM | POA: Insufficient documentation

## 2018-06-21 DIAGNOSIS — F250 Schizoaffective disorder, bipolar type: Secondary | ICD-10-CM | POA: Insufficient documentation

## 2018-06-21 LAB — COMPREHENSIVE METABOLIC PANEL
ALK PHOS: 62 U/L (ref 38–126)
ALT: 24 U/L (ref 0–44)
ANION GAP: 21 — AB (ref 5–15)
AST: 30 U/L (ref 15–41)
Albumin: 4.8 g/dL (ref 3.5–5.0)
BUN: 9 mg/dL (ref 6–20)
CALCIUM: 9.4 mg/dL (ref 8.9–10.3)
CO2: 18 mmol/L — ABNORMAL LOW (ref 22–32)
Chloride: 100 mmol/L (ref 98–111)
Creatinine, Ser: 1.16 mg/dL (ref 0.61–1.24)
GFR calc non Af Amer: >60 mL/min (ref >60)
Glucose, Bld: 97 mg/dL (ref 70–99)
Potassium: 3.4 mmol/L — ABNORMAL LOW (ref 3.5–5.1)
Sodium: 139 mmol/L (ref 135–145)
Total Bilirubin: 0.9 mg/dL (ref 0.3–1.2)
Total Protein: 8 g/dL (ref 6.5–8.1)

## 2018-06-21 LAB — SALICYLATE LEVEL

## 2018-06-21 LAB — RAPID URINE DRUG SCREEN, HOSP PERFORMED
Amphetamines: NOT DETECTED
Barbiturates: NOT DETECTED
Benzodiazepines: NOT DETECTED
Cocaine: NOT DETECTED
OPIATES: NOT DETECTED
TETRAHYDROCANNABINOL: NOT DETECTED

## 2018-06-21 LAB — CBC
HCT: 48.1 % (ref 39.0–52.0)
Hemoglobin: 16.3 g/dL (ref 13.0–17.0)
MCH: 32 pg (ref 26.0–34.0)
MCHC: 33.9 g/dL (ref 30.0–36.0)
MCV: 94.3 fL (ref 80.0–100.0)
Platelets: 393 10*3/uL (ref 150–400)
RBC: 5.1 MIL/uL (ref 4.22–5.81)
RDW: 12 % (ref 11.5–15.5)
WBC: 8.2 10*3/uL (ref 4.0–10.5)
nRBC: 0 % (ref 0.0–0.2)

## 2018-06-21 LAB — ACETAMINOPHEN LEVEL

## 2018-06-21 LAB — ETHANOL: ALCOHOL ETHYL (B): 121 mg/dL — AB (ref <10)

## 2018-06-21 MED ORDER — ALUM & MAG HYDROXIDE-SIMETH 200-200-20 MG/5ML PO SUSP: 30.0000 mL | Freq: Four times a day (QID) | ORAL | Status: DC | PRN

## 2018-06-21 MED ORDER — NICOTINE 21 MG/24HR TD PT24: 21.0000 mg | MEDICATED_PATCH | Freq: Every day | TRANSDERMAL | Status: DC

## 2018-06-21 MED ORDER — ONDANSETRON HCL 4 MG PO TABS: 4.0000 mg | ORAL_TABLET | Freq: Three times a day (TID) | ORAL | Status: DC | PRN

## 2018-06-21 MED ORDER — IBUPROFEN 400 MG PO TABS: 600.0000 mg | ORAL_TABLET | Freq: Three times a day (TID) | ORAL | Status: DC | PRN
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
## 2018-06-21 NOTE — ED Notes (Signed)
Pt cooperating with nurse.  Pt is calm at this time. BP rechecked.

## 2018-06-21 NOTE — ED Notes (Signed)
Awaiting IVC papers to be delivered.

## 2018-06-21 NOTE — ED Notes (Signed)
Pt in burgandy scrubs, clothes taken to locker 6, and pt wanded.

## 2018-06-22 MED ORDER — DIPHENHYDRAMINE HCL 50 MG/ML IJ SOLN: 50.0000 mg | Freq: Two times a day (BID) | INTRAMUSCULAR | Status: DC

## 2018-06-22 MED ORDER — GABAPENTIN 300 MG PO CAPS
300.0000 mg | ORAL_CAPSULE | Freq: Three times a day (TID) | ORAL | Status: DC
  Administered 2018-06-23: 300 mg via ORAL
  Filled 2018-06-22 (×2): qty 1

## 2018-06-22 MED ORDER — LORAZEPAM 2 MG/ML IJ SOLN
0.0000 mg | Freq: Four times a day (QID) | INTRAMUSCULAR | Status: DC
  Administered 2018-06-22: 2 mg via INTRAVENOUS
  Filled 2018-06-22: qty 1

## 2018-06-22 MED ORDER — LORAZEPAM 1 MG PO TABS: 0.0000 mg | ORAL_TABLET | Freq: Four times a day (QID) | ORAL | Status: DC

## 2018-06-22 MED ORDER — OLANZAPINE 5 MG PO TABS
10.0000 mg | ORAL_TABLET | Freq: Two times a day (BID) | ORAL | Status: DC
  Administered 2018-06-23: 10 mg via ORAL
  Filled 2018-06-22: qty 2

## 2018-06-22 MED ORDER — THIAMINE HCL 100 MG/ML IJ SOLN: 100.0000 mg | Freq: Every day | INTRAMUSCULAR | Status: DC

## 2018-06-22 MED ORDER — ZIPRASIDONE MESYLATE 20 MG IM SOLR
20.0000 mg | Freq: Once | INTRAMUSCULAR | Status: AC
  Administered 2018-06-22: 20 mg via INTRAMUSCULAR
  Filled 2018-06-22: qty 20

## 2018-06-22 MED ORDER — OLANZAPINE 10 MG IM SOLR: 10.0000 mg | Freq: Two times a day (BID) | INTRAMUSCULAR | Status: DC

## 2018-06-22 MED ORDER — LORAZEPAM 2 MG/ML IJ SOLN: 0.0000 mg | Freq: Two times a day (BID) | INTRAMUSCULAR | Status: DC

## 2018-06-22 MED ORDER — DIPHENHYDRAMINE HCL 25 MG PO CAPS
50.0000 mg | ORAL_CAPSULE | Freq: Two times a day (BID) | ORAL | Status: DC
  Filled 2018-06-22: qty 2

## 2018-06-22 MED ORDER — VITAMIN B-1 100 MG PO TABS
100.0000 mg | ORAL_TABLET | Freq: Every day | ORAL | Status: DC
  Administered 2018-06-23: 100 mg via ORAL
  Filled 2018-06-22: qty 1

## 2018-06-22 MED ORDER — STERILE WATER FOR INJECTION IJ SOLN
INTRAMUSCULAR | Status: AC
  Administered 2018-06-22: 1.2 mL
  Filled 2018-06-22: qty 10

## 2018-06-22 MED ORDER — LORAZEPAM 1 MG PO TABS: 0.0000 mg | ORAL_TABLET | Freq: Two times a day (BID) | ORAL | Status: DC
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
## 2018-06-22 NOTE — ED Notes (Signed)
Pt sleeping. 

## 2018-06-22 NOTE — ED Notes (Signed)
Security had promised pt a grilled chicken salad if he would cooperate - pt sitting on bed eating salad.

## 2018-06-22 NOTE — ED Provider Notes (Signed)
Chart and vitals reviewed.    Patient meets inpatient criteria, pending bed placement.  Has been calm and cooperative since he first arrived.  Is here under IVC.  Formed by RN that patient is agitated and needs something for agitation.  Patient has Ativan ordered already, but RN states patient would not take p.o.  Patient does have IM Ativan, however will give dose of Geodon for increased agitation.   Alveria Apley, PA-C 06/22/18 1218    Gwyneth Sprout, MD 06/22/18 1520
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
## 2018-06-22 NOTE — ED Notes (Signed)
Breakfast tray ordered 

## 2018-06-22 NOTE — ED Notes (Signed)
TTS completed. 

## 2018-06-22 NOTE — ED Notes (Signed)
Pt ate breakfast and is now laying down on bed.

## 2018-06-22 NOTE — Progress Notes (Signed)
Patient meets criteria for inpatient treatment. No appropriate or available beds at [facility]. CSW faxed referrals to the following facilities for review:  [facility], [facility], [facility], [facility], [facility], [facility], [facility], [facility], [facility], [facility], [facility], and [facility].  TTS will continue to seek bed placement.  Vilma Meckel. Algis Greenhouse, MSW, LCSW Clinical Social Work/Disposition Phone: [phone] Fax: [phone]

## 2018-06-22 NOTE — ED Notes (Signed)
Patient denies pain and is resting comfortably.  

## 2018-06-22 NOTE — ED Notes (Signed)
IVC papers brought to the desk by Dr. Jeraldine Loots. Faxed to [place] at [facility].
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
## 2018-06-22 NOTE — ED Notes (Signed)
Gave pt snack and drink 

## 2018-06-22 NOTE — ED Notes (Signed)
Pts belongings inventoried and placed in locker #6. Two bags.

## 2018-06-23 NOTE — ED Notes (Signed)
Pt cooperative and pleasant. Understands the need to be calm and cooperative for his benefit.

## 2018-06-23 NOTE — Progress Notes (Signed)
Patient reports he got upset prior to admission because he thought one of his children was his but is not.  Medications were started and he has been calm and cooperative, stabilized.  No suicidal/homicidal ideations, hallucinations, and substance abuse.  Stable for discharge AFTER collateral obtained from family or close support system.  Dr Lucianne Muss reviewed this patient and concurs with the plan.  Nanine Means, PMHNP

## 2018-06-23 NOTE — ED Notes (Signed)
Pt sleeping. 

## 2018-06-23 NOTE — ED Notes (Signed)
Breakfast tray ordered 
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
## 2018-06-23 NOTE — ED Triage Notes (Signed)
Pt did take B vit. But refused all other meds. Pt reported he was going to stay cool.

## 2018-06-23 NOTE — ED Notes (Signed)
Pt was very pleasant this evening and states he feels he will be ready to leave in the AM; pt refused Benadryl stating it makes him sleepy and he does not have a cold-Monique,RN

## 2018-06-23 NOTE — ED Triage Notes (Signed)
PT refusing all meds that will make him sleepy. Pt reported they will let me go home if I keep my cool.

## 2018-06-23 NOTE — Progress Notes (Signed)
Patient cannot return home to live with his mother due to his behaviors.  However, he is calm and taking medications in the ED with no threats to self or others, no hallucinations. Stable for discharge but will need a new place to live.  Social work consult placed for assistance with shelters--should be considered for discharge tomorrow, if he continues to remain stable.  Nanine Means, PMHNP

## 2018-06-24 NOTE — ED Notes (Signed)
IVC papers rescinded - copy faxed to [facility], copy sent to Medical Records, and original placed in folder for Magistrate.

## 2018-06-24 NOTE — ED Notes (Signed)
ALL belongings - 2 labeled belongings bags - NO Valuables envelope noted - returned to pt - Pt signed verifying all items present. D/C paperwork given w/[facility] from [facility]. Bus pass also given.

## 2018-06-24 NOTE — Progress Notes (Signed)
CSW received call from RN and was informed that pt would like be discharged once speaking with EDP. CSW was asked to provide pt with resources for housing. CSW provided RN with shelter resources as well as [employer] at this time. There appears to be no further CSW need. CSW will sign off.     Claude Manges. Delorese Sellin, MSW, LCSW-A Emergency Department Clinical Social Worker [phone]

## 2018-12-02 ENCOUNTER — Observation Stay (HOSPITAL_COMMUNITY)
Admission: EM | Admit: 2018-12-02 | Discharge: 2018-12-03 | Disposition: A | Discharge status: Home / Self Care | Payer: Self-pay | Attending: Otolaryngology | Admitting: Otolaryngology

## 2018-12-02 ENCOUNTER — Emergency Department (HOSPITAL_COMMUNITY): Payer: Self-pay

## 2018-12-02 ENCOUNTER — Other Ambulatory Visit: Payer: Self-pay

## 2018-12-02 ENCOUNTER — Encounter (HOSPITAL_COMMUNITY): Payer: Self-pay | Admitting: *Deleted

## 2018-12-02 DIAGNOSIS — S02652B Fracture of angle of left mandible, initial encounter for open fracture: Secondary | ICD-10-CM | POA: Insufficient documentation

## 2018-12-02 DIAGNOSIS — S02652A Fracture of angle of left mandible, initial encounter for closed fracture: Secondary | ICD-10-CM

## 2018-12-02 DIAGNOSIS — S0269XB Fracture of mandible of other specified site, initial encounter for open fracture: Principal | ICD-10-CM | POA: Insufficient documentation

## 2018-12-02 DIAGNOSIS — S02609A Fracture of mandible, unspecified, initial encounter for closed fracture: Secondary | ICD-10-CM | POA: Diagnosis present

## 2018-12-02 DIAGNOSIS — S022XXA Fracture of nasal bones, initial encounter for closed fracture: Secondary | ICD-10-CM | POA: Insufficient documentation

## 2018-12-02 DIAGNOSIS — Z1159 Encounter for screening for other viral diseases: Secondary | ICD-10-CM | POA: Insufficient documentation

## 2018-12-02 DIAGNOSIS — F17210 Nicotine dependence, cigarettes, uncomplicated: Secondary | ICD-10-CM | POA: Insufficient documentation

## 2018-12-02 DIAGNOSIS — S02609B Fracture of mandible, unspecified, initial encounter for open fracture: Secondary | ICD-10-CM | POA: Diagnosis present

## 2018-12-02 LAB — SARS CORONAVIRUS 2 BY RT PCR (HOSPITAL ORDER, PERFORMED IN ~~LOC~~ HOSPITAL LAB): SARS Coronavirus 2: NEGATIVE

## 2018-12-02 MED ORDER — HYDROMORPHONE HCL 1 MG/ML IJ SOLN
0.5000 mg | Freq: Once | INTRAMUSCULAR | Status: AC
  Administered 2018-12-02: 0.5 mg via INTRAVENOUS
  Filled 2018-12-02: qty 1

## 2018-12-02 MED ORDER — CLINDAMYCIN PHOSPHATE 600 MG/50ML IV SOLN
600.0000 mg | Freq: Three times a day (TID) | INTRAVENOUS | Status: DC
  Administered 2018-12-02 – 2018-12-03 (×2): 600 mg via INTRAVENOUS
  Filled 2018-12-02 (×2): qty 50

## 2018-12-02 MED ORDER — MORPHINE SULFATE (PF) 4 MG/ML IV SOLN
4.0000 mg | INTRAVENOUS | Status: DC | PRN
  Administered 2018-12-02 – 2018-12-03 (×3): 4 mg via INTRAVENOUS
  Filled 2018-12-02 (×3): qty 1
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
## 2018-12-02 NOTE — ED Notes (Signed)
Hedges, PA notified re: bradycardia, EKG to be completed

## 2018-12-02 NOTE — Progress Notes (Signed)
Received pt from ED. Pt alert and oriented x4. Skin intact.Oriented to room and call bell.
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
## 2018-12-02 NOTE — Plan of Care (Signed)
  Problem: Education: Goal: Knowledge of General Education information will improve Description: Including pain rating scale, medication(s)/side effects and non-pharmacologic comfort measures Outcome: Progressing   Problem: Activity: Goal: Risk for activity intolerance will decrease Outcome: Progressing   

## 2018-12-02 NOTE — ED Notes (Signed)
Pt to have surgery per PA, pt verbalizing that he does not want to have the COVID test completed, pt on phone at this time, pt agitated, will follow up once pt is off the phone

## 2018-12-03 ENCOUNTER — Observation Stay (HOSPITAL_COMMUNITY): Payer: Self-pay | Admitting: Registered Nurse

## 2018-12-03 ENCOUNTER — Encounter (HOSPITAL_COMMUNITY): Payer: Self-pay | Admitting: Registered Nurse

## 2018-12-03 ENCOUNTER — Encounter (HOSPITAL_COMMUNITY)
Admission: EM | Disposition: A | Discharge status: Home / Self Care | Payer: Self-pay | Source: Home / Self Care | Attending: Emergency Medicine

## 2018-12-03 HISTORY — PX: ORIF MANDIBULAR FRACTURE: SHX2127

## 2018-12-03 LAB — SURGICAL PCR SCREEN
MRSA, PCR: NEGATIVE
Staphylococcus aureus: NEGATIVE

## 2018-12-03 LAB — HIV ANTIBODY (ROUTINE TESTING W REFLEX): HIV Screen 4th Generation wRfx: NONREACTIVE

## 2018-12-03 SURGERY — OPEN REDUCTION INTERNAL FIXATION (ORIF) MANDIBULAR FRACTURE
Anesthesia: General

## 2018-12-03 MED ORDER — OXYMETAZOLINE HCL 0.05 % NA SOLN
NASAL | Status: DC | PRN
  Administered 2018-12-03: 3 via NASAL

## 2018-12-03 MED ORDER — CHLORHEXIDINE GLUCONATE 0.12 % MT SOLN
OROMUCOSAL | Status: DC | PRN
  Administered 2018-12-03: 5 mL via OROMUCOSAL

## 2018-12-03 MED ORDER — ONDANSETRON HCL 4 MG/2ML IJ SOLN: 4.0000 mg | Freq: Once | INTRAMUSCULAR | Status: DC | PRN

## 2018-12-03 MED ORDER — OXYCODONE HCL 5 MG/5ML PO SOLN
ORAL | Status: AC
  Filled 2018-12-03: qty 5

## 2018-12-03 MED ORDER — PROPOFOL 10 MG/ML IV BOLUS
INTRAVENOUS | Status: AC
  Filled 2018-12-03: qty 20

## 2018-12-03 MED ORDER — LIDOCAINE 2% (20 MG/ML) 5 ML SYRINGE
INTRAMUSCULAR | Status: AC
  Filled 2018-12-03: qty 5

## 2018-12-03 MED ORDER — HYDROMORPHONE HCL 1 MG/ML IJ SOLN
INTRAMUSCULAR | Status: AC
  Filled 2018-12-03: qty 1

## 2018-12-03 MED ORDER — DEXMEDETOMIDINE HCL IN NACL 80 MCG/20ML IV SOLN
INTRAVENOUS | Status: AC
  Filled 2018-12-03: qty 20

## 2018-12-03 MED ORDER — DEXAMETHASONE SODIUM PHOSPHATE 10 MG/ML IJ SOLN
INTRAMUSCULAR | Status: DC | PRN
  Administered 2018-12-03: 10 mg via INTRAVENOUS

## 2018-12-03 MED ORDER — ROCURONIUM BROMIDE 10 MG/ML (PF) SYRINGE
PREFILLED_SYRINGE | INTRAVENOUS | Status: AC
  Filled 2018-12-03: qty 10

## 2018-12-03 MED ORDER — SUGAMMADEX SODIUM 200 MG/2ML IV SOLN
INTRAVENOUS | Status: DC | PRN
  Administered 2018-12-03: 200 mg via INTRAVENOUS

## 2018-12-03 MED ORDER — CHLORHEXIDINE GLUCONATE 0.12 % MT SOLN: OROMUCOSAL | 0 refills | Status: DC

## 2018-12-03 MED ORDER — LIDOCAINE-EPINEPHRINE 1 %-1:100000 IJ SOLN
INTRAMUSCULAR | Status: DC | PRN
  Administered 2018-12-03: 1 mL

## 2018-12-03 MED ORDER — LIDOCAINE-EPINEPHRINE 1 %-1:100000 IJ SOLN
INTRAMUSCULAR | Status: AC
  Filled 2018-12-03: qty 1

## 2018-12-03 MED ORDER — MIDAZOLAM HCL 5 MG/5ML IJ SOLN
INTRAMUSCULAR | Status: DC | PRN
  Administered 2018-12-03: 2 mg via INTRAVENOUS

## 2018-12-03 MED ORDER — OXYCODONE HCL 5 MG/5ML PO SOLN
5.0000 mg | Freq: Once | ORAL | Status: AC
  Administered 2018-12-03: 5 mg via ORAL

## 2018-12-03 MED ORDER — HYDROMORPHONE HCL 1 MG/ML IJ SOLN
0.2500 mg | INTRAMUSCULAR | Status: DC | PRN
  Administered 2018-12-03 (×4): 0.5 mg via INTRAVENOUS

## 2018-12-03 MED ORDER — FENTANYL CITRATE (PF) 250 MCG/5ML IJ SOLN
INTRAMUSCULAR | Status: AC
  Filled 2018-12-03: qty 5

## 2018-12-03 MED ORDER — ROCURONIUM BROMIDE 10 MG/ML (PF) SYRINGE
PREFILLED_SYRINGE | INTRAVENOUS | Status: DC | PRN
  Administered 2018-12-03: 30 mg via INTRAVENOUS

## 2018-12-03 MED ORDER — DEXMEDETOMIDINE HCL 200 MCG/2ML IV SOLN
INTRAVENOUS | Status: DC | PRN
  Administered 2018-12-03 (×2): 8 ug via INTRAVENOUS

## 2018-12-03 MED ORDER — HYDROMORPHONE HCL 1 MG/ML IJ SOLN: 0.2500 mg | INTRAMUSCULAR | Status: DC | PRN

## 2018-12-03 MED ORDER — AMOXICILLIN 400 MG/5ML PO SUSR: 800.0000 mg | Freq: Two times a day (BID) | ORAL | 0 refills | Status: AC

## 2018-12-03 MED ORDER — EPHEDRINE 5 MG/ML INJ
INTRAVENOUS | Status: AC
  Filled 2018-12-03: qty 10

## 2018-12-03 MED ORDER — MEPERIDINE HCL 25 MG/ML IJ SOLN: 6.2500 mg | INTRAMUSCULAR | Status: DC | PRN

## 2018-12-03 MED ORDER — SUCCINYLCHOLINE CHLORIDE 200 MG/10ML IV SOSY
PREFILLED_SYRINGE | INTRAVENOUS | Status: AC
  Filled 2018-12-03: qty 10

## 2018-12-03 MED ORDER — HYDROCODONE-ACETAMINOPHEN 7.5-325 MG/15ML PO SOLN: 15.0000 mL | ORAL | 0 refills | Status: AC | PRN

## 2018-12-03 MED ORDER — ONDANSETRON HCL 4 MG/2ML IJ SOLN
INTRAMUSCULAR | Status: DC | PRN
  Administered 2018-12-03: 4 mg via INTRAVENOUS

## 2018-12-03 MED ORDER — LACTATED RINGERS IV SOLN
INTRAVENOUS | Status: DC | PRN
  Administered 2018-12-03: 08:00:00 via INTRAVENOUS

## 2018-12-03 MED ORDER — SUCCINYLCHOLINE CHLORIDE 200 MG/10ML IV SOSY
PREFILLED_SYRINGE | INTRAVENOUS | Status: DC | PRN
  Administered 2018-12-03: 140 mg via INTRAVENOUS

## 2018-12-03 MED ORDER — SODIUM CHLORIDE (PF) 0.9 % IJ SOLN
INTRAMUSCULAR | Status: AC
  Filled 2018-12-03: qty 20

## 2018-12-03 MED ORDER — PROPOFOL 10 MG/ML IV BOLUS
INTRAVENOUS | Status: DC | PRN
  Administered 2018-12-03: 200 mg via INTRAVENOUS

## 2018-12-03 MED ORDER — 0.9 % SODIUM CHLORIDE (POUR BTL) OPTIME
TOPICAL | Status: DC | PRN
  Administered 2018-12-03: 1000 mL

## 2018-12-03 MED ORDER — FENTANYL CITRATE (PF) 250 MCG/5ML IJ SOLN
INTRAMUSCULAR | Status: DC | PRN
  Administered 2018-12-03: 100 ug via INTRAVENOUS
  Administered 2018-12-03: 50 ug via INTRAVENOUS
  Administered 2018-12-03 (×2): 25 ug via INTRAVENOUS
  Administered 2018-12-03: 50 ug via INTRAVENOUS

## 2018-12-03 MED ORDER — CHLORHEXIDINE GLUCONATE 0.12 % MT SOLN
OROMUCOSAL | Status: AC
  Filled 2018-12-03: qty 15

## 2018-12-03 MED ORDER — LIDOCAINE 2% (20 MG/ML) 5 ML SYRINGE
INTRAMUSCULAR | Status: DC | PRN
  Administered 2018-12-03: 60 mg via INTRAVENOUS

## 2018-12-03 MED ORDER — MIDAZOLAM HCL 2 MG/2ML IJ SOLN
INTRAMUSCULAR | Status: AC
  Filled 2018-12-03: qty 2

## 2018-12-03 SURGICAL SUPPLY — 29 items
BAG DECANTER FOR FLEXI CONT (MISCELLANEOUS) IMPLANT
BLADE SURG 15 STRL LF DISP TIS (BLADE) IMPLANT
BLADE SURG 15 STRL SS (BLADE) ×4
CANISTER SUCT 3000ML PPV (MISCELLANEOUS) ×3 IMPLANT
CLEANER TIP ELECTROSURG 2X2 (MISCELLANEOUS) ×3 IMPLANT
COVER WAND RF STERILE (DRAPES) ×3 IMPLANT
DRAPE HALF SHEET 40X57 (DRAPES) ×2 IMPLANT
ELECT COATED BLADE 2.86 ST (ELECTRODE) ×2 IMPLANT
ELECT NDL BLADE 2-5/6 (NEEDLE) IMPLANT
ELECT NEEDLE BLADE 2-5/6 (NEEDLE) IMPLANT
ELECT REM PT RETURN 9FT ADLT (ELECTROSURGICAL) ×3
ELECTRODE REM PT RTRN 9FT ADLT (ELECTROSURGICAL) ×1 IMPLANT
GLOVE ECLIPSE 7.5 STRL STRAW (GLOVE) ×3 IMPLANT
GOWN STRL REUS W/ TWL LRG LVL3 (GOWN DISPOSABLE) ×2 IMPLANT
GOWN STRL REUS W/TWL LRG LVL3 (GOWN DISPOSABLE) ×4
KIT BASIN OR (CUSTOM PROCEDURE TRAY) ×3 IMPLANT
KIT TURNOVER KIT B (KITS) ×3 IMPLANT
NDL HYPO 25GX1X1/2 BEV (NEEDLE) IMPLANT
NEEDLE HYPO 25GX1X1/2 BEV (NEEDLE) ×3 IMPLANT
NS IRRIG 1000ML POUR BTL (IV SOLUTION) ×3 IMPLANT
PAD ARMBOARD 7.5X6 YLW CONV (MISCELLANEOUS) ×6 IMPLANT
PENCIL BUTTON HOLSTER BLD 10FT (ELECTRODE) ×3 IMPLANT
SCISSORS WIRE ANG 4 3/4 DISP (INSTRUMENTS) ×2 IMPLANT
SUT STEEL 2 (SUTURE) ×2 IMPLANT
SUT VIC AB 3-0 FS2 27 (SUTURE) ×2 IMPLANT
TOOTHBRUSH ADULT (PERSONAL CARE ITEMS) ×3 IMPLANT
TOWEL GREEN STERILE FF (TOWEL DISPOSABLE) ×3 IMPLANT
TRAY ENT MC OR (CUSTOM PROCEDURE TRAY) ×3 IMPLANT
WATER STERILE IRR 1000ML POUR (IV SOLUTION) IMPLANT

## 2018-12-03 NOTE — Discharge Instructions (Signed)
Jaw Fracture Eating Plan A break (fracture) of the jaw bone often needs surgery for treatment. After surgery, you will need to eat foods that can be blended so that they can be sipped from a straw or given through a syringe. Work with a diet and nutrition specialist (dietitian) to create an eating plan that helps you get the nutrients you need in order to heal and stay healthy. What are tips for following this plan? General guidelines  All foods in this plan must be blended. Avoid nuts, seeds, skins, peels, bones, or any foods that cannot be blended to the right consistency.  Ask your health care provider about taking a liquid multivitamin to make sure that you get all the vitamins and minerals you need. Cooking  Before blending, remove any skins, seeds, or peels from food.  Cook meats and vegetables until tender.  Cut foods into small pieces and mix with a small amount of liquid in a food processor or blender. Continue to add liquid until the food becomes thin enough to sip through a straw.  Add liquids such as juice, milk, cream, broth, gravy, or vegetable juice to help add flavor to foods.  Heat foods after they have been blended, not before. This reduces the amount of foam created from blending.  If you need to increase calories in food: ? Add protein powder or powdered milk to foods. ? Cook with fats, such as margarine (without trans fat), sour cream, cream cheese, cream, or nut butters. ? Prepare foods with sweeteners, such as honey, ice cream, blackstrap molasses, or sugar. Meal planning  Eat at least three meals and three snacks daily. It is important to make sure that you get enough calories and protein to prevent weight loss and help your body heal, especially after surgery.  Eat a variety of foods from each food group every day, including fruits and vegetables, protein, whole grains, dairy, and healthy fats.  If your teeth and mouth are sensitive to extreme temperatures, heat  or cool your foods to lukewarm temperatures. What foods are recommended? The items listed may not be a complete list. Talk with your dietitian about what dietary choices are best for you. Grains Hot cereals, such as oatmeal, grits, ground wheat cereals, and polenta. Rice and pasta. Couscous. Vegetables All cooked or canned vegetables, without seeds and skins. Vegetable juices. Cooked potatoes, without skins. Fruits Any cooked or canned fruits, without seeds and skins. Fresh, peeled soft fruits, such as bananas and peaches, that can be blended until smooth. All fruit juices, without seeds and skins. Meat and other protein foods Soft-boiled eggs, scrambled eggs, powdered eggs, pasteurized egg mixtures, and custard. Ground meats, such as hamburger, [place], sausage, and meatloaf. Tender, well-cooked meat, poultry, and fish, prepared without bones or skin. Soft soy foods, such as tofu. Smooth nut butters. Liquid egg substitutes. Dairy Milk. Cheese. Yogurt. Cottage cheese. Pudding. Beverages Coffee (regular or decaffeinated), tea, and mineral water. Liquid supplements that have protein and calories. Fats and oils Any oils. Melted margarine or butter. Ghee. Sour cream. Cream cheese. Avocado. Seasoning and other foods All seasonings and condiments that blend well. Ground spices. Finely ground seeds and nuts. Mustard or any smooth condiment. Summary  Foods in this plan need to be prepared so that they can be sipped from a straw or given through a syringe. Try to have at least three meals and three snacks daily.  Avoid nuts, seeds, skins, peels, bones, or any foods that cannot be blended to the  right consistency. Make sure you eat a variety of foods from each food group every day.  Include a liquid multivitamin in your plan as told by your health care provider or dietitian. This information is not intended to replace advice given to you by your health care provider. Make sure you discuss any  questions you have with your health care provider. Document Released: 10/18/2009 Document Revised: 08/22/2018 Document Reviewed: 08/07/2016 Elsevier Patient Education  2020 [employer]  ------------------------------------------    Wired Jaw Care You may have your jaw wired shut for many reasons, including a broken jaw or jaw surgery. The wires help hold your jaw in place while you heal. Follow these instructions at home: Mouth care  Rinse your mouth with a warm salt-water mixture after eating or drinking anything. To make a salt-water mixture, mix  tsp of salt in 1 cup of warm water.  If you were prescribed a prescription mouthwash, use it as told by your health care provider.  Brush the front of your teeth with a child-sized, soft toothbrush after you eat.  If you need to vomit, bend over and open your lips. Always rinse out your mouth and brush your teeth after vomiting. Managing pain and swelling  Follow instructions from your health care provider about how to help the swelling go down.  If directed, put ice on the affected area. ? Put ice in a plastic bag. ? Place a towel between your skin and the bag. ? Leave the ice on for 20 minutes, 2-3 times a day.  Sit up or prop yourself up with pillows behind your back to help the swelling go down.  Take over-the-counter and prescription medicines only as told by your health care provider.  Use petroleum jelly on your lips to keep them from drying and cracking.  Cover the wire with dental wax if any wires are poking into your lips or gums. Activity  Do not drive or use heavy machinery while taking prescription pain medicine.  Do not play sports until your health care provider says it is okay. Safety  Keep wire cutters with you at all times. Use them only in an emergency to cut the wires that hold your jaw together.  You may cut the wires that hold your jaw together only: ? If you have trouble breathing. ? If you are  choking.  If you must cut the wires in an emergency, cut straight across the wires that hold your mouth closed. These are the wires that are connected to the wires that connect to your back teeth (arch wires). Do not cut the arch wires. Eating and drinking   Follow instructions from your health care provider about what you can and cannot eat.  You will need to follow a liquid diet. Blend all foods so that they can be sipped from a straw or given through a syringe. Avoid nuts, seeds, skins, peels, bones, or any foods that cannot be blended to the right consistency.  If your teeth and mouth are sensitive to extreme temperatures, heat or cool your foods to lukewarm temperatures. General instructions  Do not cut the wires: ? Even if you need to vomit. ? Even if you are hungry. ? Even if you are tired of having your jaw wired.  If you are taking prescription pain medicine, take actions to prevent or treat constipation. ? Take an over-the-counter or prescription medicine for constipation. ? Drink enough fluid to keep your urine pale yellow.  Keep all follow-up  visits as told by your health care provider. This is important. Contact a health care provider if:  You have a fever.  You feel nauseous or you vomit.  You feel that one or more wires have broken.  You have fluid, blood, or pus coming from your mouth or incisions.  You are dizzy. Get help right away if:  You had to cut the wires that hold your jaw together.  Your pain is severe and is not helped by medicine.  You faint. Summary  You may have your jaw wired shut after a broken jaw or jaw surgery. The wires help hold your jaw in place while you heal.  Rinse your mouth with a warm salt-water mixture after eating or drinking anything. Use prescription mouthwash if your health care provider tells you to use it.  Follow instructions from your health care provider about how to help the swelling go down, how to manage your  pain, and what you should and should not eat.  Keep wire cutters with you at all times. Use them only in an emergency to cut the wires that hold your jaw together. Cut straight across the wires that hold your mouth closed. Do not cut the wires that connect to your back teeth (arch wires). This information is not intended to replace advice given to you by your health care provider. Make sure you discuss any questions you have with your health care provider. Document Released: 02/07/2008 Document Revised: 08/18/2018 Document Reviewed: 06/10/2017 Elsevier Patient Education  2020 [employer]
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
## 2018-12-03 NOTE — Discharge Summary (Signed)
Physician Discharge Summary  Patient ID: [name] MRN: [id number] DOB/AGE: [birth year] 38 y.o.  Admit date: 12/02/2018 Discharge date: 12/03/2018  Admission Diagnoses: Mandibular fractures  Discharge Diagnoses: Mandibular fractures Active Problems:   Mandible fracture ([place])   Mandibular fracture, open ([place])   Discharged Condition: fair  Hospital Course: Pt underwent MMF to treat his multiple mandibular fractures. He will need to be on liquid diet for 4-6 weeks.  Consults: None  Significant Diagnostic Studies: None  Treatments: surgery: MMF  Discharge Exam: Blood pressure (!) 104/58, pulse (!) 53, temperature 98.2 F (36.8 C), temperature source Oral, resp. rate 18, height 5\' 11"  (1.803 m), weight 93 kg, SpO2 92 %. MMF in place.  Disposition: Discharge disposition: 01-Home or Self Care       Discharge Instructions    Activity as tolerated - No restrictions   Complete by: As directed    Diet general   Complete by: As directed      Allergies as of 12/03/2018      Reactions   Pork-derived Products       Medication List    TAKE these medications   amoxicillin 400 MG/5ML suspension Commonly known as: AMOXIL Take 10 mLs (800 mg total) by mouth 2 (two) times daily for 5 days.   chlorhexidine 0.12 % solution Commonly known as: Peridex 30ml swish and spit 2 times a day.   HYDROcodone-acetaminophen 7.5-325 mg/15 ml solution Commonly known as: HYCET Take 15 mLs by mouth every 4 (four) hours as needed for up to 5 days for severe pain.      Follow-up Information    Leta Baptist, MD. Schedule an appointment as soon as possible for a visit in 2 weeks.   Specialty: Otolaryngology Contact information: [address]  [place] [address] [phone]           Signed: Burley Saver 12/03/2018, 11:24 AM

## 2018-12-03 NOTE — Progress Notes (Signed)
Pt went to OR.

## 2018-12-03 NOTE — Transfer of Care (Signed)
Immediate Anesthesia Transfer of Care Note  Patient: [name]  Procedure(s) Performed: OPEN REDUCTION INTERNAL FIXATION (ORIF) MANDIBULAR FRACTURE (N/A )  Patient Location: PACU  Anesthesia Type:General  Level of Consciousness: awake, alert  and oriented  Airway & Oxygen Therapy: Patient Spontanous Breathing and Patient connected to face mask oxygen  Post-op Assessment: Report given to RN and Post -op Vital signs reviewed and stable  Post vital signs: Reviewed and stable  Last Vitals:  Vitals Value Taken Time  BP 143/80   Temp    Pulse 73   Resp 16   SpO2 100     Last Pain:  Vitals:   12/03/18 0559  TempSrc: Axillary  PainSc:       Patients Stated Pain Goal: 0 (80/03/49 1791)  Complications: No apparent anesthesia complications

## 2018-12-03 NOTE — Anesthesia Procedure Notes (Signed)
Procedure Name: Intubation Date/Time: 12/03/2018 8:17 AM Performed by: Jearld Pies, CRNA Pre-anesthesia Checklist: Patient identified, Emergency Drugs available, Suction available and Patient being monitored Patient Re-evaluated:Patient Re-evaluated prior to induction Oxygen Delivery Method: Circle System Utilized Preoxygenation: Pre-oxygenation with 100% oxygen Induction Type: IV induction and Rapid sequence Laryngoscope Size: Glidescope and 4 Grade View: Grade I Tube type: Oral Nasal Tubes: Nasal Rae, Nasal prep performed, Left and Magill forceps- large, utilized Tube size: 7.5 mm Number of attempts: 1 Placement Confirmation: ETT inserted through vocal cords under direct vision,  positive ETCO2 and breath sounds checked- equal and bilateral Secured at: 29 cm Tube secured with: Tape Dental Injury: Teeth and Oropharynx as per pre-operative assessment

## 2018-12-03 NOTE — OR Nursing (Signed)
Pt. Was informed by Dr. Benjamine Mola  before surgery, that jaw would be wired shut for 4-6 weeks. Wire cutters sent with patient to recovery area.

## 2018-12-03 NOTE — Anesthesia Preprocedure Evaluation (Signed)
Anesthesia Evaluation  Patient identified by MRN, date of birth, ID band Patient awake    Reviewed: Allergy & Precautions, NPO status , Patient's Chart, lab work & pertinent test results  Airway Mallampati: IV  TM Distance: >3 FB Neck ROM: Full    Dental   Pulmonary asthma , Current Smoker,    Pulmonary exam normal breath sounds clear to auscultation       Cardiovascular negative cardio ROS Normal cardiovascular exam Rhythm:Regular Rate:Normal     Neuro/Psych  Headaches, PSYCHIATRIC DISORDERS Bipolar Disorder Schizophrenia    GI/Hepatic negative GI ROS, (+)     substance abuse  alcohol use,   Endo/Other  negative endocrine ROS  Renal/GU negative Renal ROS  negative genitourinary   Musculoskeletal Mandibular Fx open   Abdominal   Peds  Hematology negative hematology ROS (+)   Anesthesia Other Findings   Reproductive/Obstetrics                             Anesthesia Physical Anesthesia Plan  ASA: II  Anesthesia Plan: General   Post-op Pain Management:    Induction: Intravenous  PONV Risk Score and Plan: 2 and Ondansetron, Dexamethasone and Treatment may vary due to age or medical condition  Airway Management Planned: Nasal ETT  Additional Equipment:   Intra-op Plan:   Post-operative Plan: Extubation in OR  Informed Consent: I have reviewed the patients History and Physical, chart, labs and discussed the procedure including the risks, benefits and alternatives for the proposed anesthesia with the patient or authorized representative who has indicated his/her understanding and acceptance.     Dental advisory given  Plan Discussed with: CRNA and Surgeon  Anesthesia Plan Comments:         Anesthesia Quick Evaluation
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
## 2018-12-03 NOTE — Anesthesia Postprocedure Evaluation (Signed)
Anesthesia Post Note  Patient: [name]  Procedure(s) Performed: OPEN REDUCTION INTERNAL FIXATION (ORIF) MANDIBULAR FRACTURE (N/A )     Patient location during evaluation: PACU Anesthesia Type: General Level of consciousness: awake and alert and oriented Pain management: pain level controlled Vital Signs Assessment: post-procedure vital signs reviewed and stable Respiratory status: spontaneous breathing, nonlabored ventilation and respiratory function stable Cardiovascular status: blood pressure returned to baseline and stable Postop Assessment: no apparent nausea or vomiting Anesthetic complications: no    Last Vitals:  Vitals:   12/03/18 0918 12/03/18 0933  BP: 134/68 (!) 147/77  Pulse: (!) 50 (!) 55  Resp: 17 17  Temp:    SpO2: 100% 100%    Last Pain:  Vitals:   12/03/18 0930  TempSrc:   PainSc: 8                  Johnmatthew Solorio A.

## 2018-12-03 NOTE — Progress Notes (Signed)
Patient discharged to home with instructions and sent home with his wire cutter.

## 2018-12-04 ENCOUNTER — Encounter (HOSPITAL_COMMUNITY): Payer: Self-pay | Admitting: Otolaryngology

## 2018-12-23 ENCOUNTER — Other Ambulatory Visit: Payer: Self-pay | Admitting: Otolaryngology

## 2018-12-29 ENCOUNTER — Encounter (HOSPITAL_BASED_OUTPATIENT_CLINIC_OR_DEPARTMENT_OTHER): Payer: Self-pay | Admitting: *Deleted

## 2018-12-29 ENCOUNTER — Other Ambulatory Visit: Payer: Self-pay

## 2019-01-01 ENCOUNTER — Other Ambulatory Visit (HOSPITAL_COMMUNITY)
Admission: RE | Admit: 2019-01-01 | Discharge: 2019-01-01 | Disposition: A | Discharge status: Home / Self Care | Payer: HRSA Program | Source: Ambulatory Visit | Attending: Otolaryngology | Admitting: Otolaryngology

## 2019-01-01 DIAGNOSIS — Z01812 Encounter for preprocedural laboratory examination: Secondary | ICD-10-CM | POA: Diagnosis present

## 2019-01-01 DIAGNOSIS — Z20828 Contact with and (suspected) exposure to other viral communicable diseases: Secondary | ICD-10-CM | POA: Insufficient documentation

## 2019-01-01 LAB — SARS CORONAVIRUS 2 (TAT 6-24 HRS): SARS Coronavirus 2: NEGATIVE

## 2019-01-05 ENCOUNTER — Encounter (HOSPITAL_BASED_OUTPATIENT_CLINIC_OR_DEPARTMENT_OTHER)
Admission: RE | Disposition: A | Discharge status: Home / Self Care | Payer: Self-pay | Source: Home / Self Care | Attending: Otolaryngology

## 2019-01-05 ENCOUNTER — Ambulatory Visit (HOSPITAL_BASED_OUTPATIENT_CLINIC_OR_DEPARTMENT_OTHER): Payer: Self-pay | Admitting: Certified Registered"

## 2019-01-05 ENCOUNTER — Other Ambulatory Visit: Payer: Self-pay

## 2019-01-05 ENCOUNTER — Encounter (HOSPITAL_BASED_OUTPATIENT_CLINIC_OR_DEPARTMENT_OTHER): Payer: Self-pay

## 2019-01-05 ENCOUNTER — Ambulatory Visit (HOSPITAL_BASED_OUTPATIENT_CLINIC_OR_DEPARTMENT_OTHER)
Admission: RE | Admit: 2019-01-05 | Discharge: 2019-01-05 | Disposition: A | Discharge status: Home / Self Care | Payer: Self-pay | Attending: Otolaryngology | Admitting: Otolaryngology

## 2019-01-05 DIAGNOSIS — Z472 Encounter for removal of internal fixation device: Secondary | ICD-10-CM | POA: Insufficient documentation

## 2019-01-05 DIAGNOSIS — F17210 Nicotine dependence, cigarettes, uncomplicated: Secondary | ICD-10-CM | POA: Insufficient documentation

## 2019-01-05 HISTORY — PX: MANDIBULAR HARDWARE REMOVAL: SHX5205

## 2019-01-05 SURGERY — REMOVAL, HARDWARE, MANDIBLE
Anesthesia: Monitor Anesthesia Care | Site: Mouth | Laterality: Bilateral

## 2019-01-05 MED ORDER — LACTATED RINGERS IV SOLN
INTRAVENOUS | Status: DC
  Administered 2019-01-05: 13:00:00 via INTRAVENOUS

## 2019-01-05 MED ORDER — METOCLOPRAMIDE HCL 5 MG/ML IJ SOLN: 10.0000 mg | Freq: Once | INTRAMUSCULAR | Status: DC | PRN

## 2019-01-05 MED ORDER — ONDANSETRON HCL 4 MG/2ML IJ SOLN
INTRAMUSCULAR | Status: AC
  Filled 2019-01-05: qty 2

## 2019-01-05 MED ORDER — FENTANYL CITRATE (PF) 100 MCG/2ML IJ SOLN
50.0000 ug | INTRAMUSCULAR | Status: AC | PRN
  Administered 2019-01-05 (×2): 25 ug via INTRAVENOUS
  Administered 2019-01-05: 50 ug via INTRAVENOUS

## 2019-01-05 MED ORDER — LACTATED RINGERS IV SOLN
INTRAVENOUS | Status: DC
  Administered 2019-01-05: 11:00:00 via INTRAVENOUS

## 2019-01-05 MED ORDER — GLYCOPYRROLATE 0.2 MG/ML IJ SOLN
INTRAMUSCULAR | Status: DC | PRN
  Administered 2019-01-05: 0.2 mg via INTRAVENOUS

## 2019-01-05 MED ORDER — FENTANYL CITRATE (PF) 100 MCG/2ML IJ SOLN
INTRAMUSCULAR | Status: AC
  Filled 2019-01-05: qty 2

## 2019-01-05 MED ORDER — FENTANYL CITRATE (PF) 100 MCG/2ML IJ SOLN
25.0000 ug | INTRAMUSCULAR | Status: DC | PRN
  Administered 2019-01-05 (×2): 25 ug via INTRAVENOUS

## 2019-01-05 MED ORDER — MIDAZOLAM HCL 2 MG/2ML IJ SOLN
INTRAMUSCULAR | Status: AC
  Filled 2019-01-05: qty 2

## 2019-01-05 MED ORDER — GLYCOPYRROLATE PF 0.2 MG/ML IJ SOSY
PREFILLED_SYRINGE | INTRAMUSCULAR | Status: AC
  Filled 2019-01-05: qty 1

## 2019-01-05 MED ORDER — LIDOCAINE 2% (20 MG/ML) 5 ML SYRINGE
INTRAMUSCULAR | Status: AC
  Filled 2019-01-05: qty 5

## 2019-01-05 MED ORDER — LIDOCAINE-EPINEPHRINE 1 %-1:100000 IJ SOLN
INTRAMUSCULAR | Status: DC | PRN
  Administered 2019-01-05: .5 mL

## 2019-01-05 MED ORDER — MEPERIDINE HCL 25 MG/ML IJ SOLN: 6.2500 mg | INTRAMUSCULAR | Status: DC | PRN

## 2019-01-05 MED ORDER — MIDAZOLAM HCL 2 MG/2ML IJ SOLN
1.0000 mg | INTRAMUSCULAR | Status: DC | PRN
  Administered 2019-01-05: 2 mg via INTRAVENOUS

## 2019-01-05 MED ORDER — PROPOFOL 500 MG/50ML IV EMUL
INTRAVENOUS | Status: DC | PRN
  Administered 2019-01-05: 200 ug/kg/min via INTRAVENOUS

## 2019-01-05 MED ORDER — ONDANSETRON HCL 4 MG/2ML IJ SOLN
INTRAMUSCULAR | Status: DC | PRN
  Administered 2019-01-05: 4 mg via INTRAVENOUS

## 2019-01-05 SURGICAL SUPPLY — 30 items
BLADE SURG 15 STRL LF DISP TIS (BLADE) ×1 IMPLANT
BLADE SURG 15 STRL SS (BLADE) ×2
CANISTER SUCT 1200ML W/VALVE (MISCELLANEOUS) ×3 IMPLANT
COVER MAYO STAND REUSABLE (DRAPES) ×3 IMPLANT
COVER WAND RF STERILE (DRAPES) IMPLANT
DECANTER SPIKE VIAL GLASS SM (MISCELLANEOUS) ×1 IMPLANT
DRAPE HALF SHEET 70X43 (DRAPES) ×3 IMPLANT
GAUZE 4X4 16PLY RFD (DISPOSABLE) IMPLANT
GAUZE SPONGE 4X4 12PLY STRL LF (GAUZE/BANDAGES/DRESSINGS) IMPLANT
GLOVE BIO SURGEON STRL SZ7.5 (GLOVE) ×3 IMPLANT
GLOVE BIOGEL PI IND STRL 7.0 (GLOVE) IMPLANT
GLOVE BIOGEL PI INDICATOR 7.0 (GLOVE) ×2
GLOVE ECLIPSE 6.5 STRL STRAW (GLOVE) ×2 IMPLANT
GOWN STRL REUS W/ TWL LRG LVL3 (GOWN DISPOSABLE) ×2 IMPLANT
GOWN STRL REUS W/TWL LRG LVL3 (GOWN DISPOSABLE) ×4
MARKER SKIN DUAL TIP RULER LAB (MISCELLANEOUS) IMPLANT
NDL PRECISIONGLIDE 27X1.5 (NEEDLE) ×1 IMPLANT
NEEDLE PRECISIONGLIDE 27X1.5 (NEEDLE) ×3 IMPLANT
NS IRRIG 1000ML POUR BTL (IV SOLUTION) ×1 IMPLANT
PACK BASIN DAY SURGERY FS (CUSTOM PROCEDURE TRAY) ×3 IMPLANT
SCISSORS WIRE ANG 4 3/4 DISP (INSTRUMENTS) IMPLANT
SUT CHROMIC 3 0 PS 2 (SUTURE) IMPLANT
SUT CHROMIC 4 0 PS 2 18 (SUTURE) IMPLANT
SUT CHROMIC 4 0 RB 1X27 (SUTURE) IMPLANT
SYR CONTROL 10ML LL (SYRINGE) ×3 IMPLANT
TOWEL GREEN STERILE FF (TOWEL DISPOSABLE) ×3 IMPLANT
TRAY DSU PREP LF (CUSTOM PROCEDURE TRAY) IMPLANT
TUBE CONNECTING 20'X1/4 (TUBING) ×1
TUBE CONNECTING 20X1/4 (TUBING) ×2 IMPLANT
YANKAUER SUCT BULB TIP NO VENT (SUCTIONS) ×2 IMPLANT

## 2019-01-05 NOTE — Discharge Instructions (Addendum)
°  Post Anesthesia Home Care Instructions  Activity: Get plenty of rest for the remainder of the day. A responsible individual must stay with you for 24 hours following the procedure.  For the next 24 hours, DO NOT: -Drive a car -Paediatric nurse -Drink alcoholic beverages -Take any medication unless instructed by your physician -Make any legal decisions or sign important papers.  Meals: Start with liquid foods such as gelatin or soup. Progress to regular foods as tolerated. Avoid greasy, spicy, heavy foods. If nausea and/or vomiting occur, drink only clear liquids until the nausea and/or vomiting subsides. Call your physician if vomiting continues.  Special Instructions/Symptoms: Your throat may feel dry or sore from the anesthesia or the breathing tube placed in your throat during surgery. If this causes discomfort, gargle with warm salt water. The discomfort should disappear within 24 hours.  If you had a scopolamine patch placed behind your ear for the management of post- operative nausea and/or vomiting:  1. The medication in the patch is effective for 72 hours, after which it should be removed.  Wrap patch in a tissue and discard in the trash. Wash hands thoroughly with soap and water. 2. You may remove the patch earlier than 72 hours if you experience unpleasant side effects which may include dry mouth, dizziness or visual disturbances. 3. Avoid touching the patch. Wash your hands with soap and water after contact with the patch.      Call your surgeon if you experience:   1.  Fever over 101.0. 2.  Inability to urinate. 3.  Nausea and/or vomiting. 4.  Extreme swelling or bruising at the surgical site. 5.  Continued bleeding from the incision. 6.  Increased pain, redness or drainage from the incision. 7.  Problems related to your pain medication. 8.  Any problems and/or concerns   The patient may resume all his previous activities and regular diet.  He may take  Tylenol/ibuprofen as needed pain.  He may follow-up in my office as needed.

## 2019-01-05 NOTE — Anesthesia Preprocedure Evaluation (Signed)
Anesthesia Evaluation  Patient identified by MRN, date of birth, ID band Patient awake    Reviewed: Allergy & Precautions, NPO status , Patient's Chart, lab work & pertinent test results  Airway Mallampati: IV  TM Distance: >3 FB Neck ROM: Full    Dental   Pulmonary asthma , Current Smoker and Patient abstained from smoking.,    Pulmonary exam normal breath sounds clear to auscultation       Cardiovascular negative cardio ROS Normal cardiovascular exam Rhythm:Regular Rate:Normal     Neuro/Psych  Headaches, PSYCHIATRIC DISORDERS Bipolar Disorder Schizophrenia    GI/Hepatic negative GI ROS, (+)     substance abuse  alcohol use,   Endo/Other  negative endocrine ROS  Renal/GU negative Renal ROS  negative genitourinary   Musculoskeletal Mandibular Fx open   Abdominal   Peds  Hematology negative hematology ROS (+)   Anesthesia Other Findings   Reproductive/Obstetrics                             Anesthesia Physical  Anesthesia Plan  ASA: II  Anesthesia Plan: MAC   Post-op Pain Management:    Induction: Intravenous  PONV Risk Score and Plan: 2 and Ondansetron, Dexamethasone and Treatment may vary due to age or medical condition  Airway Management Planned: Nasal Cannula  Additional Equipment:   Intra-op Plan:   Post-operative Plan:   Informed Consent: I have reviewed the patients History and Physical, chart, labs and discussed the procedure including the risks, benefits and alternatives for the proposed anesthesia with the patient or authorized representative who has indicated his/her understanding and acceptance.     Dental advisory given  Plan Discussed with: CRNA and Surgeon  Anesthesia Plan Comments:         Anesthesia Quick Evaluation
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
## 2019-01-05 NOTE — Anesthesia Postprocedure Evaluation (Signed)
Anesthesia Post Note  Patient: [name]  Procedure(s) Performed: MAXILLOMANDIBULAR HARDWARE REMOVAL (Bilateral Mouth)     Patient location during evaluation: PACU Anesthesia Type: MAC Level of consciousness: awake and alert Pain management: pain level controlled Vital Signs Assessment: post-procedure vital signs reviewed and stable Respiratory status: spontaneous breathing, nonlabored ventilation, respiratory function stable and patient connected to nasal cannula oxygen Cardiovascular status: stable and blood pressure returned to baseline Postop Assessment: no apparent nausea or vomiting Anesthetic complications: no    Last Vitals:  Vitals:   01/05/19 1300 01/05/19 1315  BP: (!) 163/100 140/88  Pulse: (!) 46 (!) 38  Resp: 13 14  Temp:    SpO2: 99% 100%    Last Pain:  Vitals:   01/05/19 1252  TempSrc:   PainSc: 6                  Montez Hageman

## 2019-01-05 NOTE — Transfer of Care (Signed)
Immediate Anesthesia Transfer of Care Note  Patient: [name]  Procedure(s) Performed: MAXILLOMANDIBULAR HARDWARE REMOVAL (Bilateral Mouth)  Patient Location: PACU  Anesthesia Type:MAC  Level of Consciousness: awake, alert  and oriented  Airway & Oxygen Therapy: Patient Spontanous Breathing and Patient connected to nasal cannula oxygen  Post-op Assessment: Report given to RN and Post -op Vital signs reviewed and stable  Post vital signs: Reviewed and stable  Last Vitals:  Vitals Value Taken Time  BP    Temp    Pulse    Resp    SpO2      Last Pain:  Vitals:   01/05/19 1019  TempSrc: Oral  PainSc: 0-No pain      Patients Stated Pain Goal: 3 (86/75/44 9201)  Complications: No apparent anesthesia complications

## 2019-01-06 ENCOUNTER — Encounter (HOSPITAL_BASED_OUTPATIENT_CLINIC_OR_DEPARTMENT_OTHER): Payer: Self-pay | Admitting: Otolaryngology
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
## 2022-07-12 ENCOUNTER — Other Ambulatory Visit: Payer: Self-pay

## 2022-07-12 ENCOUNTER — Emergency Department (HOSPITAL_COMMUNITY)
Admission: EM | Admit: 2022-07-12 | Discharge: 2022-07-13 | Disposition: A | Discharge status: Home / Self Care | Attending: Emergency Medicine | Admitting: Emergency Medicine

## 2022-07-12 DIAGNOSIS — R140 Abdominal distension (gaseous): Secondary | ICD-10-CM

## 2022-07-12 DIAGNOSIS — J45909 Unspecified asthma, uncomplicated: Secondary | ICD-10-CM | POA: Insufficient documentation

## 2022-07-12 LAB — CBC WITH DIFFERENTIAL/PLATELET
Abs Immature Granulocytes: 0.01 10*3/uL (ref 0.00–0.07)
Basophils Absolute: 0 10*3/uL (ref 0.0–0.1)
Basophils Relative: 1 %
Eosinophils Absolute: 0.2 10*3/uL (ref 0.0–0.5)
Eosinophils Relative: 4 %
HCT: 37.6 % — ABNORMAL LOW (ref 39.0–52.0)
Hemoglobin: 12.9 g/dL — ABNORMAL LOW (ref 13.0–17.0)
Immature Granulocytes: 0 %
Lymphocytes Relative: 43 %
Lymphs Abs: 2.3 10*3/uL (ref 0.7–4.0)
MCH: 32.1 pg (ref 26.0–34.0)
MCHC: 34.3 g/dL (ref 30.0–36.0)
MCV: 93.5 fL (ref 80.0–100.0)
Monocytes Absolute: 0.6 10*3/uL (ref 0.1–1.0)
Monocytes Relative: 11 %
Neutro Abs: 2.2 10*3/uL (ref 1.7–7.7)
Neutrophils Relative %: 41 %
Platelets: 266 10*3/uL (ref 150–400)
RBC: 4.02 MIL/uL — ABNORMAL LOW (ref 4.22–5.81)
RDW: 12.9 % (ref 11.5–15.5)
WBC: 5.4 10*3/uL (ref 4.0–10.5)
nRBC: 0 % (ref 0.0–0.2)

## 2022-07-12 LAB — URINALYSIS, ROUTINE W REFLEX MICROSCOPIC
Bilirubin Urine: NEGATIVE
Glucose, UA: NEGATIVE mg/dL
Hgb urine dipstick: NEGATIVE
Ketones, ur: NEGATIVE mg/dL
Leukocytes,Ua: NEGATIVE
Nitrite: NEGATIVE
Protein, ur: NEGATIVE mg/dL
Specific Gravity, Urine: >1.03 — ABNORMAL HIGH (ref 1.005–1.030)
pH: 6 (ref 5.0–8.0)

## 2022-07-12 LAB — LIPASE, BLOOD: Lipase: 47 U/L (ref 11–51)

## 2022-07-12 LAB — COMPREHENSIVE METABOLIC PANEL
ALT: 30 U/L (ref 0–44)
AST: 30 U/L (ref 15–41)
Albumin: 3.6 g/dL (ref 3.5–5.0)
Alkaline Phosphatase: 50 U/L (ref 38–126)
Anion gap: 9 (ref 5–15)
BUN: 9 mg/dL (ref 6–20)
CO2: 27 mmol/L (ref 22–32)
Calcium: 8.5 mg/dL — ABNORMAL LOW (ref 8.9–10.3)
Chloride: 106 mmol/L (ref 98–111)
Creatinine, Ser: 1.02 mg/dL (ref 0.61–1.24)
GFR, Estimated: >60 mL/min (ref >60)
Glucose, Bld: 97 mg/dL (ref 70–99)
Potassium: 3.9 mmol/L (ref 3.5–5.1)
Sodium: 142 mmol/L (ref 135–145)
Total Bilirubin: 0.6 mg/dL (ref 0.3–1.2)
Total Protein: 5.8 g/dL — ABNORMAL LOW (ref 6.5–8.1)

## 2022-07-12 NOTE — ED Triage Notes (Signed)
Patient suspects " worms in my stomach" , reports anal itching with worms in stools for several weeks /feels bloated.

## 2022-07-12 NOTE — ED Provider Triage Note (Signed)
Emergency Medicine Provider Triage Evaluation Note  [name] , a 42 y.o. male  was evaluated in triage.  Pt complains of intestinal parasites.  Reports he has been feeling bloated in his abdomen for several weeks and feels like his abdomen is distended.  Has had few episodes of looser stools and some episodes of vomiting.  Denies any fevers.  Denies any obvious parasites or worms in stool.  Does report that he had some anal itching as well.  Review of Systems  Positive: As above Negative: As above  Physical Exam  There were no vitals taken for this visit. Gen:   Awake, no distress   Resp:  Normal effort  MSK:   Moves extremities without difficulty  Other:  No tenderness to palpation in abdomen, some distention noted  Medical Decision Making  Medically screening exam initiated at 8:04 PM.  Appropriate orders placed.  [name] was informed that the remainder of the evaluation will be completed by another provider, this initial triage assessment does not replace that evaluation, and the importance of remaining in the ED until their evaluation is complete.     Luvenia Heller, PA-C 07/12/22 2006
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
## 2022-07-13 NOTE — Discharge Instructions (Signed)
You can use a stool softener as we discussed.  Please follow-up with the physician number that is provided.

## 2022-07-14 ENCOUNTER — Emergency Department (HOSPITAL_COMMUNITY)

## 2022-07-14 ENCOUNTER — Emergency Department (HOSPITAL_COMMUNITY)
Admission: EM | Admit: 2022-07-14 | Discharge: 2022-07-14 | Disposition: A | Discharge status: Home / Self Care | Attending: Emergency Medicine | Admitting: Emergency Medicine

## 2022-07-14 DIAGNOSIS — R001 Bradycardia, unspecified: Secondary | ICD-10-CM | POA: Diagnosis not present

## 2022-07-14 DIAGNOSIS — R1013 Epigastric pain: Secondary | ICD-10-CM | POA: Insufficient documentation

## 2022-07-14 LAB — CBC WITH DIFFERENTIAL/PLATELET
Abs Immature Granulocytes: 0.01 10*3/uL (ref 0.00–0.07)
Basophils Absolute: 0 10*3/uL (ref 0.0–0.1)
Basophils Relative: 1 %
Eosinophils Absolute: 0.2 10*3/uL (ref 0.0–0.5)
Eosinophils Relative: 5 %
HCT: 39.3 % (ref 39.0–52.0)
Hemoglobin: 12.9 g/dL — ABNORMAL LOW (ref 13.0–17.0)
Immature Granulocytes: 0 %
Lymphocytes Relative: 40 %
Lymphs Abs: 1.8 10*3/uL (ref 0.7–4.0)
MCH: 31.5 pg (ref 26.0–34.0)
MCHC: 32.8 g/dL (ref 30.0–36.0)
MCV: 96.1 fL (ref 80.0–100.0)
Monocytes Absolute: 0.4 10*3/uL (ref 0.1–1.0)
Monocytes Relative: 9 %
Neutro Abs: 2.1 10*3/uL (ref 1.7–7.7)
Neutrophils Relative %: 45 %
Platelets: 251 10*3/uL (ref 150–400)
RBC: 4.09 MIL/uL — ABNORMAL LOW (ref 4.22–5.81)
RDW: 13.1 % (ref 11.5–15.5)
WBC: 4.7 10*3/uL (ref 4.0–10.5)
nRBC: 0 % (ref 0.0–0.2)

## 2022-07-14 LAB — COMPREHENSIVE METABOLIC PANEL
ALT: 37 U/L (ref 0–44)
AST: 33 U/L (ref 15–41)
Albumin: 3.4 g/dL — ABNORMAL LOW (ref 3.5–5.0)
Alkaline Phosphatase: 49 U/L (ref 38–126)
Anion gap: 7 (ref 5–15)
BUN: 8 mg/dL (ref 6–20)
CO2: 25 mmol/L (ref 22–32)
Calcium: 8.6 mg/dL — ABNORMAL LOW (ref 8.9–10.3)
Chloride: 110 mmol/L (ref 98–111)
Creatinine, Ser: 0.83 mg/dL (ref 0.61–1.24)
GFR, Estimated: >60 mL/min (ref >60)
Glucose, Bld: 87 mg/dL (ref 70–99)
Potassium: 4.4 mmol/L (ref 3.5–5.1)
Sodium: 142 mmol/L (ref 135–145)
Total Bilirubin: 0.2 mg/dL — ABNORMAL LOW (ref 0.3–1.2)
Total Protein: 5.2 g/dL — ABNORMAL LOW (ref 6.5–8.1)

## 2022-07-14 LAB — URINALYSIS, ROUTINE W REFLEX MICROSCOPIC
Bilirubin Urine: NEGATIVE
Glucose, UA: NEGATIVE mg/dL
Hgb urine dipstick: NEGATIVE
Ketones, ur: NEGATIVE mg/dL
Leukocytes,Ua: NEGATIVE
Nitrite: NEGATIVE
Protein, ur: NEGATIVE mg/dL
Specific Gravity, Urine: 1.02 (ref 1.005–1.030)
pH: 6 (ref 5.0–8.0)

## 2022-07-14 MED ORDER — FAMOTIDINE 20 MG PO TABS: 20.0000 mg | ORAL_TABLET | Freq: Every day | ORAL | 0 refills | Status: DC

## 2022-07-14 MED ORDER — SUCRALFATE 1 G PO TABS: 1.0000 g | ORAL_TABLET | Freq: Three times a day (TID) | ORAL | 0 refills | Status: DC

## 2022-07-14 NOTE — ED Provider Notes (Signed)
[facility] Provider Note   CSN: [id number] Arrival date & time: 07/14/22  1456     History Chief Complaint  Patient presents with   Abdominal Pain    [name] is a 42 y.o. male.  Patient with past medical history significant for schizoaffective disorder presents to the emergency department complaining of generalized abdominal pain.  Patient was seen here in the emergency department 2 days ago with complaints of parasites in his abdomen.  At that time, patient not report any presence of any parasites or worms in his stool or any abnormal stool findings.  Patient does not report he has had abdominal pain for the last 3 months and has been worse in the past month specifically.  Denies any significant nausea, vomiting, diarrhea.  No fevers, chest pain, shortness of breath. He reports increasing constipation for the last few months but is still able to pass gas. Feels that "insides are trying to come out" but denies significant pain.   Abdominal Pain Associated symptoms: no diarrhea, no fever, no nausea and no vomiting        Home Medications Prior to Admission medications   Medication Sig Start Date End Date Taking? Authorizing Provider  famotidine (PEPCID) 20 MG tablet Take 1 tablet (20 mg total) by mouth daily. 07/14/22  Yes Luvenia Heller, PA-C  sucralfate (CARAFATE) 1 g tablet Take 1 tablet (1 g total) by mouth 4 (four) times daily -  with meals and at bedtime. 07/14/22 08/13/22 Yes Luvenia Heller, PA-C      Allergies    Pork-derived products    Review of Systems   Review of Systems  Constitutional:  Negative for fever.  Gastrointestinal:  Positive for abdominal pain. Negative for diarrhea, nausea and vomiting.  All other systems reviewed and are negative.   Physical Exam Updated Vital Signs BP 107/60 (BP Location: Left Arm)   Pulse (!) 52   Temp 98.3 F (36.8 C) (Oral)   Resp 15   SpO2 98%  Physical Exam Vitals and  nursing note reviewed.  Constitutional:      Appearance: He is well-developed.  HENT:     Head: Normocephalic and atraumatic.  Cardiovascular:     Rate and Rhythm: Regular rhythm. Bradycardia present.  Pulmonary:     Effort: Pulmonary effort is normal.  Abdominal:     General: Abdomen is flat.     Palpations: Abdomen is soft.     Tenderness: There is generalized abdominal tenderness. There is guarding. There is no right CVA tenderness or left CVA tenderness.  Skin:    General: Skin is warm and dry.     Capillary Refill: Capillary refill takes less than 2 seconds.     Findings: No rash.  Neurological:     Mental Status: He is alert.     ED Results / Procedures / Treatments   Labs (all labs ordered are listed, but only abnormal results are displayed) Labs Reviewed  COMPREHENSIVE METABOLIC PANEL - Abnormal; Notable for the following components:      Result Value   Calcium 8.6 (*)    Total Protein 5.2 (*)    Albumin 3.4 (*)    Total Bilirubin 0.2 (*)    All other components within normal limits  CBC WITH DIFFERENTIAL/PLATELET - Abnormal; Notable for the following components:   RBC 4.09 (*)    Hemoglobin 12.9 (*)    All other components within normal limits  URINALYSIS, ROUTINE  W REFLEX MICROSCOPIC    EKG None  Radiology CT ABDOMEN PELVIS WO CONTRAST  Addendum Date: 07/14/2022   ADDENDUM REPORT: 07/14/2022 16:42 IMPRESSION: No obstructing renal stone. No bowel obstruction, free air or free fluid. Of note the appendix is not clearly seen in the right lower quadrant but no pericecal stranding or fluid. Cardiac enlargement. Electronically Signed   By: Jill Side M.D.   On: 07/14/2022 16:42   Result Date: 07/14/2022 CLINICAL DATA:  Abdominal pain EXAM: CT ABDOMEN AND PELVIS WITHOUT CONTRAST TECHNIQUE: Multidetector CT imaging of the abdomen and pelvis was performed following the standard protocol without IV contrast. RADIATION DOSE REDUCTION: This exam was performed according  to the departmental dose-optimization program which includes automated exposure control, adjustment of the mA and/or kV according to patient size and/or use of iterative reconstruction technique. COMPARISON:  CT 01/24/2014 FINDINGS: Lower chest: There is some linear opacity seen along bases likely scar or atelectasis. No pleural effusion. The heart is enlarged. Hepatobiliary: On this non IV contrast exam, grossly the liver is unremarkable. No obvious space-occupying lesion. The gallbladder is contracted. Pancreas: Unremarkable. No pancreatic ductal dilatation or surrounding inflammatory changes. Spleen: Normal in size without focal abnormality. Adrenals/Urinary Tract: Adrenal glands are preserved. No abnormal calcification is seen within either kidney nor along the course of either ureter. Contracted urinary bladder. Stomach/Bowel: There is mild debris in the stomach. Large bowel has a normal course and caliber with mild scattered colonic stool. Small bowel is nondilated. The appendix is not clearly seen in the right lower quadrant but there is no pericecal stranding or fluid. Vascular/Lymphatic: Normal caliber aorta and IVC. No specific abnormal lymph node enlargement identified in the abdomen and pelvis on this noncontrast exam Reproductive: Prostate is unremarkable. Other: No ascites. Musculoskeletal: Mild degenerative changes. Transitional lumbosacral segment. IMPRESSION: No obstructing renal stone. Bowel obstruction, free air or free fluid. Of note the appendix is not clearly seen in the right lower quadrant but no pericecal stranding or fluid. Cardiac enlargement. Electronically Signed: By: Jill Side M.D. On: 07/14/2022 16:15    Procedures Procedures   Medications Ordered in ED Medications - No data to display  ED Course/ Medical Decision Making/ A&P Clinical Course as of 07/14/22 1725  Sat Jul 14, 2022  1723 CBC with Differential(!) Largely normal CBC [OZ]  1723 Comprehensive metabolic  panel(!) CMP not indicating any electrolyte abnormalities [OZ]  1723 Urinalysis, Routine w reflex microscopic -Urine, Clean Catch Negative UA [OZ]  1724 CT ABDOMEN PELVIS WO CONTRAST CT abdomen pelvis without any acute indications of obstruction or other concerning etiology [OZ]    Clinical Course User Index [OZ] Luvenia Heller, PA-C                           Medical Decision Making Amount and/or Complexity of Data Reviewed Labs: ordered. Radiology: ordered.   This patient presents to the ED for concern of abdominal pain.  Differential diagnosis includes gastritis, gastroparesis, bowel obstruction, diverticulitis, food intolerance   Lab Tests:  I Ordered, and personally interpreted labs.  The pertinent results include: CBC largely normal with only mild drop in hemoglobin, CMP largely normal with no electrolyte abnormality, urinalysis negative for UTI   Imaging Studies ordered:  I ordered imaging studies including CT abdomen pelvis I independently visualized and interpreted imaging which showed no acute abdominal abnormalities indicating symptom presentation, no appendicitis, no cholecystitis, no pancreatitis I agree with the radiologist interpretation   Medicines ordered  and prescription drug management:  I have reviewed the patients home medicines and have made adjustments as needed   Problem List / ED Course:  Patient presents emergency department complaints of abdominal pain.  Patient was here department 2 days ago complaining of parasites but no concerning findings on exam or in history warranted further workup at that time.  Patient reports that he is having abdominal pain has been present for about 3 months without significant improvement.  He does report recently has been experiencing more constipation than typical.  Denies any significant pain with bowel movements or any blood in his stool.  Lab workup was reassuring and CT imaging was also reassuring without any acute  abnormalities noted on exam.  Patient likely has some kind of gastritis present possibly due to alcohol use, NSAID use, or possible H. pylori infection.  Advised patient that we can trial a course of famotidine and sucralfate to see if patient has symptomatic improvement which may indicate the presence of a possible gastric ulcer.  Also advised patient he should plan to follow-up with [facility] gastroenterology for further evaluation if symptoms or not improving.  Patient was agreeable to treatment plan and verbalized understanding return precautions.  All questions answered prior to patient discharge.   Social Determinants of Health:  History of schizoaffective disorder  Final Clinical Impression(s) / ED Diagnoses Final diagnoses:  Epigastric pain    Rx / DC Orders ED Discharge Orders          Ordered    famotidine (PEPCID) 20 MG tablet  Daily        07/14/22 1722    sucralfate (CARAFATE) 1 g tablet  3 times daily with meals & bedtime        07/14/22 1722              Luvenia Heller, PA-C 07/14/22 [place], [place], DO 07/14/22 2303

## 2022-07-14 NOTE — Discharge Instructions (Signed)
You were seen in the ER for abdominal pain. Thankfully your labs and imaging were reassuring without evidence of any infectious causes of your abdominal pain. There is a chance this abdominal pain could be related to medications, alcohol use, or a bacterial cause. I have sent a course of medications to try in an effort to see if this improves your symptoms. You likely need to follow up with gastroenterology for further evaluation if symptoms are not improving.

## 2022-07-14 NOTE — ED Triage Notes (Signed)
Abdominal pain x 3 months that has been worse since last month. Denies nausea, vomiting or diarrhea.

## 2022-07-30 ENCOUNTER — Emergency Department (HOSPITAL_COMMUNITY)
Admission: EM | Admit: 2022-07-30 | Discharge: 2022-07-30 | Disposition: A | Discharge status: Home / Self Care | Attending: Emergency Medicine | Admitting: Emergency Medicine

## 2022-07-30 DIAGNOSIS — B829 Intestinal parasitism, unspecified: Secondary | ICD-10-CM | POA: Insufficient documentation

## 2022-07-30 LAB — URINALYSIS, ROUTINE W REFLEX MICROSCOPIC
Bilirubin Urine: NEGATIVE
Glucose, UA: NEGATIVE mg/dL
Hgb urine dipstick: NEGATIVE
Ketones, ur: NEGATIVE mg/dL
Leukocytes,Ua: NEGATIVE
Nitrite: NEGATIVE
Protein, ur: NEGATIVE mg/dL
Specific Gravity, Urine: 1.025 (ref 1.005–1.030)
pH: 6 (ref 5.0–8.0)

## 2022-07-30 LAB — CBC
HCT: 43.9 % (ref 39.0–52.0)
Hemoglobin: 14.5 g/dL (ref 13.0–17.0)
MCH: 31.5 pg (ref 26.0–34.0)
MCHC: 33 g/dL (ref 30.0–36.0)
MCV: 95.2 fL (ref 80.0–100.0)
Platelets: 311 10*3/uL (ref 150–400)
RBC: 4.61 MIL/uL (ref 4.22–5.81)
RDW: 12.9 % (ref 11.5–15.5)
WBC: 4.8 10*3/uL (ref 4.0–10.5)
nRBC: 0 % (ref 0.0–0.2)

## 2022-07-30 LAB — COMPREHENSIVE METABOLIC PANEL
ALT: 15 U/L (ref 0–44)
AST: 18 U/L (ref 15–41)
Albumin: 4 g/dL (ref 3.5–5.0)
Alkaline Phosphatase: 61 U/L (ref 38–126)
Anion gap: 7 (ref 5–15)
BUN: 8 mg/dL (ref 6–20)
CO2: 25 mmol/L (ref 22–32)
Calcium: 8.7 mg/dL — ABNORMAL LOW (ref 8.9–10.3)
Chloride: 107 mmol/L (ref 98–111)
Creatinine, Ser: 0.77 mg/dL (ref 0.61–1.24)
GFR, Estimated: >60 mL/min (ref >60)
Glucose, Bld: 89 mg/dL (ref 70–99)
Potassium: 3.7 mmol/L (ref 3.5–5.1)
Sodium: 139 mmol/L (ref 135–145)
Total Bilirubin: 0.6 mg/dL (ref 0.3–1.2)
Total Protein: 6.6 g/dL (ref 6.5–8.1)

## 2022-07-30 LAB — LIPASE, BLOOD: Lipase: 42 U/L (ref 11–51)

## 2022-07-30 MED ORDER — IVERMECTIN 3 MG PO TABS: 200.0000 ug/kg | ORAL_TABLET | Freq: Every day | ORAL | 0 refills | Status: DC

## 2022-07-30 NOTE — Discharge Instructions (Signed)
Return to the ED with any new or worsening signs or symptoms Please follow-up with [facility] as previously referred Please pick up ivermectin.  You will take 5-1/2 tablets once a day for 2 days.

## 2022-07-30 NOTE — ED Triage Notes (Signed)
Patient here for evaluation after stating he has seen what he believes are worms in three recent stools. Patient states he brought a sample of his most recent stool containing the worms. Patient also states he has a wound on right lower leg that he feels like a parasite is eating from the inside.

## 2022-07-30 NOTE — ED Provider Notes (Signed)
[facility] Provider Note   CSN: [id number] Arrival date & time: 07/30/22  [id number]     History No chief complaint on file.   [name] is a 42 y.o. male with medical history of asthma, headache, bipolar 1 disorder, schizoaffective disorder.  Patient presents to the ED for evaluation of intestinal worms.  Patient reports that for the last 3 months he has had what he states are worms in his stool every time he has a bowel movement.  The patient was seen for this complaint 2 times in the ED on 2/29 as well as 3/2.  The patient had unremarkable workups in both of these visits and was referred to [facility].  Patient has not followed up yet.  The patient states that this morning he had a bowel movement and when he wiped he noticed that there was a warm on the toilet paper that was moving, "shrunk up" when he touched it.  Patient denies any abdominal pain, nausea, vomiting, fevers. Denies diarrhea. The patient does report that he has had 2 bowel movements 1 this morning and 1 last night.  Patient states that whenever he stands up his rectum will become itchy and when he sits down the itchiness will resolve.  The patient denies any pain in his rectum, blood in stools, alcohol use, NSAID use.  HPI     Home Medications Prior to Admission medications   Medication Sig Start Date End Date Taking? Authorizing Provider  ivermectin (STROMECTOL) 3 MG TABS tablet Take 5.5 tablets (16,500 mcg total) by mouth daily. 07/30/22  Yes Azucena Cecil, PA-C  famotidine (PEPCID) 20 MG tablet Take 1 tablet (20 mg total) by mouth daily. 07/14/22   Luvenia Heller, PA-C  sucralfate (CARAFATE) 1 g tablet Take 1 tablet (1 g total) by mouth 4 (four) times daily -  with meals and at bedtime. 07/14/22 08/13/22  Luvenia Heller, PA-C      Allergies    Pork-derived products    Review of Systems   Review of Systems  Gastrointestinal:        Intestinal worms  All other  systems reviewed and are negative.   Physical Exam Updated Vital Signs BP 124/82 (BP Location: Left Arm)   Pulse (!) 49   Temp 98.8 F (37.1 C) (Oral)   Resp 16   SpO2 97%  Physical Exam Vitals and nursing note reviewed.  Constitutional:      General: He is not in acute distress.    Appearance: Normal appearance. He is not ill-appearing, toxic-appearing or diaphoretic.  HENT:     Head: Normocephalic and atraumatic.     Nose: Nose normal.     Mouth/Throat:     Mouth: Mucous membranes are moist.     Pharynx: Oropharynx is clear.  Eyes:     Extraocular Movements: Extraocular movements intact.     Conjunctiva/sclera: Conjunctivae normal.     Pupils: Pupils are equal, round, and reactive to light.  Cardiovascular:     Rate and Rhythm: Normal rate and regular rhythm.  Pulmonary:     Effort: Pulmonary effort is normal.     Breath sounds: Normal breath sounds. No wheezing.  Abdominal:     General: Abdomen is flat. Bowel sounds are normal.     Palpations: Abdomen is soft.     Tenderness: There is no abdominal tenderness.     Comments: No abdominal tenderness. Abdomen soft and compressible. No rebound or  guarding. No distension.   Musculoskeletal:     Cervical back: Normal range of motion and neck supple. No tenderness.  Skin:    General: Skin is warm and dry.     Capillary Refill: Capillary refill takes less than 2 seconds.  Neurological:     Mental Status: He is alert and oriented to person, place, and time.       ED Results / Procedures / Treatments   Labs (all labs ordered are listed, but only abnormal results are displayed) Labs Reviewed  COMPREHENSIVE METABOLIC PANEL - Abnormal; Notable for the following components:      Result Value   Calcium 8.7 (*)    All other components within normal limits  CBC  URINALYSIS, ROUTINE W REFLEX MICROSCOPIC  LIPASE, BLOOD    EKG None  Radiology No results found.  Procedures Procedures   Medications Ordered in  ED Medications - No data to display  ED Course/ Medical Decision Making/ A&P    Medical Decision Making Amount and/or Complexity of Data Reviewed Labs: ordered.   42 year old male presents to the ED for evaluation.  Please see HPI for further details.  On examination the patient is afebrile and nontachycardic.  Patient was sounds are clear bilaterally, he is not hypoxic.  Abdomen is soft and compressible with no rebound, guarding or distention.  Patient nontoxic in appearance sitting comfortably in bed.  Patient is been seen for the same complaint numerous times in the ED.  The patient was referred to GI however has not followed up with them yet.  Patient brought sample of what he believes is intestinal worms today however no obvious evidence of movement noted.  Have attached pictures in chart.  Patient lab work unremarkable.  Patient CBC without leukocytosis or anemia.  The patient lipase is within normal limits.  Patient CMP unremarkable.  Patient urinalysis unremarkable.  I did request stool sample from patient for evaluation however patient states that he is unable to have bowel movement at this time.  At this time the patient will be discharged home with short course of ivermectin.  The patient will be advised to follow-up with GI as previously referred.  The patient was advised to return to the ED with any new or worsening signs or symptoms.  The patient voiced understanding.  Patient was provided opportunity to ask questions.  Patient stable for discharge.   Final Clinical Impression(s) / ED Diagnoses Final diagnoses:  Parasites in stool    Rx / DC Orders ED Discharge Orders          Ordered    ivermectin (STROMECTOL) 3 MG TABS tablet  Daily        07/30/22 1123              Azucena Cecil, [place] 07/30/22 1124    Isla Pence, MD 07/30/22 1416

## 2022-07-30 NOTE — ED Notes (Signed)
Pt verbalized understanding of discharge instructions. Opportunity for questions provided.   Pt unable to sign d/t no signature pad available.

## 2023-03-27 ENCOUNTER — Other Ambulatory Visit: Payer: Self-pay

## 2023-03-27 ENCOUNTER — Emergency Department (HOSPITAL_COMMUNITY): Payer: Self-pay

## 2023-03-27 ENCOUNTER — Emergency Department (HOSPITAL_COMMUNITY)
Admission: EM | Admit: 2023-03-27 | Discharge: 2023-03-27 | Disposition: A | Discharge status: Home / Self Care | Payer: Self-pay | Attending: Emergency Medicine | Admitting: Emergency Medicine

## 2023-03-27 DIAGNOSIS — J069 Acute upper respiratory infection, unspecified: Secondary | ICD-10-CM | POA: Insufficient documentation

## 2023-03-27 DIAGNOSIS — R0602 Shortness of breath: Secondary | ICD-10-CM | POA: Insufficient documentation

## 2023-03-27 DIAGNOSIS — Z20822 Contact with and (suspected) exposure to covid-19: Secondary | ICD-10-CM | POA: Insufficient documentation

## 2023-03-27 DIAGNOSIS — J45909 Unspecified asthma, uncomplicated: Secondary | ICD-10-CM | POA: Insufficient documentation

## 2023-03-27 LAB — CBC WITH DIFFERENTIAL/PLATELET
Abs Immature Granulocytes: 0.01 K/uL (ref 0.00–0.07)
Basophils Absolute: 0 K/uL (ref 0.0–0.1)
Basophils Relative: 1 %
Eosinophils Absolute: 0.3 K/uL (ref 0.0–0.5)
Eosinophils Relative: 4 %
HCT: 50.7 % (ref 39.0–52.0)
Hemoglobin: 17.9 g/dL — ABNORMAL HIGH (ref 13.0–17.0)
Immature Granulocytes: 0 %
Lymphocytes Relative: 44 %
Lymphs Abs: 2.8 K/uL (ref 0.7–4.0)
MCH: 31.9 pg (ref 26.0–34.0)
MCHC: 35.3 g/dL (ref 30.0–36.0)
MCV: 90.4 fL (ref 80.0–100.0)
Monocytes Absolute: 0.8 K/uL (ref 0.1–1.0)
Monocytes Relative: 13 %
Neutro Abs: 2.4 K/uL (ref 1.7–7.7)
Neutrophils Relative %: 38 %
Platelets: 357 K/uL (ref 150–400)
RBC: 5.61 MIL/uL (ref 4.22–5.81)
RDW: 12.4 % (ref 11.5–15.5)
WBC: 6.3 K/uL (ref 4.0–10.5)
nRBC: 0 % (ref 0.0–0.2)

## 2023-03-27 LAB — RESP PANEL BY RT-PCR (RSV, FLU A&B, COVID)  RVPGX2
Influenza A by PCR: NEGATIVE
Influenza B by PCR: NEGATIVE
Resp Syncytial Virus by PCR: NEGATIVE
SARS Coronavirus 2 by RT PCR: NEGATIVE

## 2023-03-27 LAB — COMPREHENSIVE METABOLIC PANEL
ALT: 34 U/L (ref 0–44)
AST: 71 U/L — ABNORMAL HIGH (ref 15–41)
Albumin: 4.8 g/dL (ref 3.5–5.0)
Alkaline Phosphatase: 59 U/L (ref 38–126)
Anion gap: 13 (ref 5–15)
BUN: 13 mg/dL (ref 6–20)
CO2: 18 mmol/L — ABNORMAL LOW (ref 22–32)
Calcium: 9.9 mg/dL (ref 8.9–10.3)
Chloride: 105 mmol/L (ref 98–111)
Creatinine, Ser: 1.32 mg/dL — ABNORMAL HIGH (ref 0.61–1.24)
GFR, Estimated: >60 mL/min (ref >60)
Glucose, Bld: 169 mg/dL — ABNORMAL HIGH (ref 70–99)
Potassium: 3.6 mmol/L (ref 3.5–5.1)
Sodium: 136 mmol/L (ref 135–145)
Total Bilirubin: 2 mg/dL — ABNORMAL HIGH (ref <1.2)
Total Protein: 8 g/dL (ref 6.5–8.1)

## 2023-03-27 MED ORDER — ALBUTEROL SULFATE HFA 108 (90 BASE) MCG/ACT IN AERS: 2.0000 | INHALATION_SPRAY | RESPIRATORY_TRACT | Status: DC | PRN

## 2023-03-27 NOTE — ED Triage Notes (Signed)
Pt arrives to ED c/o SOB x 2 days. Pt reports productive cough and congestion. Pt denies CP. Pt endorses N/V. Pt endorses that he can hear voices. Pt denies SI/HI.

## 2023-03-27 NOTE — Discharge Instructions (Signed)
It was a pleasure caring for you today.  As discussed, you will need to follow-up with primary care for repeat labs.  Seek emergency care if experiencing any new or worsening symptoms.

## 2023-03-27 NOTE — ED Provider Notes (Addendum)
[place] EMERGENCY DEPARTMENT AT [facility] Provider Note   CSN: [id number] Arrival date & time: 03/27/23  0347     History  Chief Complaint  Patient presents with   Shortness of Breath    [name] is a 42 y.o. male with PMHx childhood asthma, bipolar 1 disorder, schizoaffective disorder who presents to ED concerned for cold sweats, cough, congestion, SOB x2 days. Patient also with nausea and vomiting initially which has since resolved. States that he has not used his albuterol inhaler in many years. Denies sick contacts. Smokes TOB.  Denies fever, chest pain, diarrhea, dysuria. Denies immonucompromise. Denies recent travel. Denies recent surgery/immobilization, hx DVT/PE, hemoptysis, hx cancer in the past 6 months, calf swelling/tenderness.  Of note, patient stating that he has been hearing voices for years and finally wants to get this checked out. Denies SI/HI. Denies recent head trauma, LOC, seizures, blood thinners.    Shortness of Breath      Home Medications Prior to Admission medications   Medication Sig Start Date End Date Taking? Authorizing Provider  famotidine (PEPCID) 20 MG tablet Take 1 tablet (20 mg total) by mouth daily. 07/14/22   Smitty Knudsen, PA-C  ivermectin (STROMECTOL) 3 MG TABS tablet Take 5.5 tablets (16,500 mcg total) by mouth daily. 07/30/22   Al Decant, PA-C  sucralfate (CARAFATE) 1 g tablet Take 1 tablet (1 g total) by mouth 4 (four) times daily -  with meals and at bedtime. 07/14/22 08/13/22  Smitty Knudsen, PA-C      Allergies    Pork-derived products    Review of Systems   Review of Systems  Respiratory:  Positive for shortness of breath.     Physical Exam Updated Vital Signs BP (!) 127/103   Pulse 72   Temp 98.6 F (37 C)   Resp 15   Ht 5\' 11"  (1.803 m)   Wt 90.7 kg   SpO2 100%   BMI 27.89 kg/m  Physical Exam Vitals and nursing note reviewed.  Constitutional:      General: He is not in acute  distress.    Appearance: He is not ill-appearing or toxic-appearing.  HENT:     Head: Normocephalic and atraumatic.     Mouth/Throat:     Mouth: Mucous membranes are moist.     Pharynx: No posterior oropharyngeal erythema.  Eyes:     General: No scleral icterus.       Right eye: No discharge.        Left eye: No discharge.     Conjunctiva/sclera: Conjunctivae normal.  Cardiovascular:     Rate and Rhythm: Normal rate and regular rhythm.     Pulses: Normal pulses.     Heart sounds: Normal heart sounds. No murmur heard. Pulmonary:     Effort: Pulmonary effort is normal. No tachypnea or respiratory distress.     Breath sounds: Normal breath sounds. No wheezing, rhonchi or rales.  Abdominal:     Tenderness: There is no abdominal tenderness.  Musculoskeletal:     Cervical back: Normal range of motion.     Right lower leg: No edema.     Left lower leg: No edema.     Comments: No tenderness to palpation of calves BL. +2 pedal pulses.  Skin:    General: Skin is warm and dry.     Findings: No rash.  Neurological:     General: No focal deficit present.     Mental Status: He is  alert. Mental status is at baseline.  Psychiatric:        Mood and Affect: Mood normal.     ED Results / Procedures / Treatments   Labs (all labs ordered are listed, but only abnormal results are displayed) Labs Reviewed  CBC WITH DIFFERENTIAL/PLATELET - Abnormal; Notable for the following components:      Result Value   Hemoglobin 17.9 (*)    All other components within normal limits  COMPREHENSIVE METABOLIC PANEL - Abnormal; Notable for the following components:   CO2 18 (*)    Glucose, Bld 169 (*)    Creatinine, Ser 1.32 (*)    AST 71 (*)    Total Bilirubin 2.0 (*)    All other components within normal limits  RESP PANEL BY RT-PCR (RSV, FLU A&B, COVID)  RVPGX2    EKG None  Radiology DG Chest 2 View  Result Date: 03/27/2023 CLINICAL DATA:  Shortness of breath. EXAM: CHEST - 2 VIEW  COMPARISON:  PA and lateral 04/08/2018 FINDINGS: The heart size and mediastinal contours are within normal limits. Both lungs are clear. The visualized skeletal structures are unremarkable. IMPRESSION: No active cardiopulmonary disease.  Stable chest. Electronically Signed   By: Almira Bar M.D.   On: 03/27/2023 06:07    Procedures Procedures    Medications Ordered in ED Medications  albuterol (VENTOLIN HFA) 108 (90 Base) MCG/ACT inhaler 2 puff (has no administration in time range)    ED Course/ Medical Decision Making/ A&P                                 Medical Decision Making Amount and/or Complexity of Data Reviewed Labs: ordered. Radiology: ordered.  Risk Prescription drug management.   This patient presents to the ED for concern of shortness of breath, this involves an extensive number of treatment options, and is a complaint that carries with it a high risk of complications and morbidity.  The differential diagnosis includes Anxiety, Arrhythmia, CHF, Asthma, COPD, PNA, COVID/Flu/RSV, STEMI, Tamponade, TPNX, DKA, Sepsis   Co morbidities that complicate the patient evaluation  Childhood asthma; bipolar 1 disorder   Additional history obtained:  It does not appear patient follows with PCP.  Will refer to community clinic.   Lab Tests:  I Ordered, and personally interpreted labs.  The pertinent results include:   - CMP: Cr mildly elevated at 1.32; CO2 low at 18; Glucose 162; no anion gap - CBC: No concern for anemia or leukocytosis - Respiratory Panel: Negative   Imaging Studies ordered:  I ordered imaging studies including  -chest xray: to assess for process contributing to patient's symptoms I independently visualized and interpreted imaging  I agree with the radiologist interpretation   Cardiac Monitoring: / EKG:  The patient was maintained on a cardiac monitor.  I personally viewed and interpreted the cardiac monitored which showed an underlying rhythm  of: Sinus rhythm without acute ST changes or arrhythmias  Problem List / ED Course / Critical interventions / Medication management  Patient presents to ED concerned for congestion, cough, SOB x 2 days.  Also with initial nausea and vomiting which has since resolved.  Wells PE score 0.  Patient afebrile with stable vitals.  Patient is overall well-appearing.  Physical exam unremarkable.  Specifically, lungs CTABL. O2 100% on RA. CBC without leukocytosis or anemia.  Respiratory panel negative. Chest x-ray without acute cardiopulmonary disease.  EKG reassuring. CMP does have  slightly low CO2 at 18 - no anion gap.  It seems that this is related to patient's recent vomiting which has since resolved. Creatinine is also mildly elevated at 1.32 - patient stating that he has been taking naproxen recently.  Educated patient on withholding naproxen and other NSAIDs for the next couple of days and staying hydrated.  Recommend following up with PCP for repeat labs within this next week.  Patient verbalized understanding of plan. It appears that patient's congestion, cough, subjective shortness of breath is due to a viral URI.  Educated patient on symptomatic medications.  Recommended following up with PCP. As for patient's auditory hallucinations over the past many years - Patient does have a history of bipolar 1 disorder and schizoaffective disorder.  Denies SI/HI.  Denies recent head trauma, LOC, seizures, blood thinners.  I recommended patient follow-up with his PCP.  Patient verbalized understanding of plan. I have reviewed the patients home medicines and have made adjustments as needed Patient was given return precautions. Patient stable for discharge at this time. Patient verbalized understanding of plan.  Discharged in good condition.  DDx: These are considered less likely due to history of present illness and physical exam findings Arrhythmia/STEMI: EKG reassuring Asthma/COPD/PNA: Lungs clear to  auscultation bilaterally and O2 sat 100% Tamponade: chest xray reassuring CHF: no physical exam findings TPNX: Lungs clear to auscultation bilaterally DKA: no hx of DM; Glucose 169; no anion gap Sepsis: afebrile and other vital signs stable   Risk Stratification Score:  Wells PE Score: 0   Social Determinants of Health:  none         Final Clinical Impression(s) / ED Diagnoses Final diagnoses:  Viral upper respiratory tract infection    Rx / DC Orders ED Discharge Orders     None           Dorthy Cooler, [place] 03/27/23 1610    Glyn Ade, MD 03/27/23 1103

## 2023-03-27 NOTE — ED Notes (Signed)
The nurse was made aware that there are no orders for cardiac monitoring.

## 2023-04-02 ENCOUNTER — Inpatient Hospital Stay (HOSPITAL_COMMUNITY)
Admission: AD | Admit: 2023-04-02 | Discharge: 2023-04-10 | DRG: 885 | Disposition: A | Discharge status: Home / Self Care | Payer: No Typology Code available for payment source | Source: Intra-hospital | Attending: Psychiatry | Admitting: Psychiatry

## 2023-04-02 ENCOUNTER — Ambulatory Visit (HOSPITAL_COMMUNITY)
Admission: EM | Admit: 2023-04-02 | Discharge: 2023-04-02 | Disposition: A | Discharge status: Other Acute Inpatient Hospital | Payer: No Payment, Other | Attending: Psychiatry | Admitting: Psychiatry

## 2023-04-02 ENCOUNTER — Other Ambulatory Visit: Payer: Self-pay

## 2023-04-02 ENCOUNTER — Encounter (HOSPITAL_COMMUNITY): Payer: Self-pay

## 2023-04-02 ENCOUNTER — Encounter (HOSPITAL_COMMUNITY): Payer: Self-pay | Admitting: Psychiatry

## 2023-04-02 ENCOUNTER — Emergency Department (HOSPITAL_COMMUNITY)
Admission: EM | Admit: 2023-04-02 | Discharge: 2023-04-02 | Disposition: A | Discharge status: Other Acute Inpatient Hospital | Payer: No Typology Code available for payment source | Attending: Emergency Medicine | Admitting: Emergency Medicine

## 2023-04-02 DIAGNOSIS — F250 Schizoaffective disorder, bipolar type: Principal | ICD-10-CM | POA: Diagnosis present

## 2023-04-02 DIAGNOSIS — R45851 Suicidal ideations: Secondary | ICD-10-CM | POA: Insufficient documentation

## 2023-04-02 DIAGNOSIS — F17210 Nicotine dependence, cigarettes, uncomplicated: Secondary | ICD-10-CM | POA: Diagnosis present

## 2023-04-02 DIAGNOSIS — Z8661 Personal history of infections of the central nervous system: Secondary | ICD-10-CM

## 2023-04-02 DIAGNOSIS — F419 Anxiety disorder, unspecified: Secondary | ICD-10-CM | POA: Diagnosis present

## 2023-04-02 DIAGNOSIS — F259 Schizoaffective disorder, unspecified: Secondary | ICD-10-CM | POA: Insufficient documentation

## 2023-04-02 DIAGNOSIS — F29 Unspecified psychosis not due to a substance or known physiological condition: Secondary | ICD-10-CM | POA: Insufficient documentation

## 2023-04-02 DIAGNOSIS — Z79899 Other long term (current) drug therapy: Secondary | ICD-10-CM

## 2023-04-02 DIAGNOSIS — Z9151 Personal history of suicidal behavior: Secondary | ICD-10-CM

## 2023-04-02 DIAGNOSIS — Z91014 Allergy to mammalian meats: Secondary | ICD-10-CM | POA: Diagnosis not present

## 2023-04-02 DIAGNOSIS — R443 Hallucinations, unspecified: Secondary | ICD-10-CM

## 2023-04-02 LAB — CBC WITH DIFFERENTIAL/PLATELET
Abs Immature Granulocytes: 0.02 10*3/uL (ref 0.00–0.07)
Basophils Absolute: 0 10*3/uL (ref 0.0–0.1)
Basophils Relative: 1 %
Eosinophils Absolute: 0.1 10*3/uL (ref 0.0–0.5)
Eosinophils Relative: 2 %
HCT: 42.5 % (ref 39.0–52.0)
Hemoglobin: 14.9 g/dL (ref 13.0–17.0)
Immature Granulocytes: 0 %
Lymphocytes Relative: 47 %
Lymphs Abs: 2.8 10*3/uL (ref 0.7–4.0)
MCH: 32.9 pg (ref 26.0–34.0)
MCHC: 35.1 g/dL (ref 30.0–36.0)
MCV: 93.8 fL (ref 80.0–100.0)
Monocytes Absolute: 0.6 10*3/uL (ref 0.1–1.0)
Monocytes Relative: 10 %
Neutro Abs: 2.4 10*3/uL (ref 1.7–7.7)
Neutrophils Relative %: 40 %
Platelets: 304 10*3/uL (ref 150–400)
RBC: 4.53 MIL/uL (ref 4.22–5.81)
RDW: 12.1 % (ref 11.5–15.5)
WBC: 5.9 10*3/uL (ref 4.0–10.5)
nRBC: 0 % (ref 0.0–0.2)

## 2023-04-02 LAB — COMPREHENSIVE METABOLIC PANEL
ALT: 25 U/L (ref 0–44)
AST: 24 U/L (ref 15–41)
Albumin: 4.3 g/dL (ref 3.5–5.0)
Alkaline Phosphatase: 58 U/L (ref 38–126)
Anion gap: 8 (ref 5–15)
BUN: 12 mg/dL (ref 6–20)
CO2: 24 mmol/L (ref 22–32)
Calcium: 9 mg/dL (ref 8.9–10.3)
Chloride: 106 mmol/L (ref 98–111)
Creatinine, Ser: 0.9 mg/dL (ref 0.61–1.24)
GFR, Estimated: >60 mL/min (ref >60)
Glucose, Bld: 102 mg/dL — ABNORMAL HIGH (ref 70–99)
Potassium: 3.5 mmol/L (ref 3.5–5.1)
Sodium: 138 mmol/L (ref 135–145)
Total Bilirubin: 1.1 mg/dL (ref <1.2)
Total Protein: 7.1 g/dL (ref 6.5–8.1)

## 2023-04-02 MED ORDER — QUETIAPINE FUMARATE 100 MG PO TABS
100.0000 mg | ORAL_TABLET | Freq: Every day | ORAL | Status: DC
  Filled 2023-04-02 (×2): qty 1

## 2023-04-02 MED ORDER — DIPHENHYDRAMINE HCL 50 MG/ML IJ SOLN: 25.0000 mg | Freq: Four times a day (QID) | INTRAMUSCULAR | Status: DC | PRN

## 2023-04-02 MED ORDER — QUETIAPINE FUMARATE 100 MG PO TABS: 100.0000 mg | ORAL_TABLET | Freq: Every day | ORAL | Status: DC

## 2023-04-02 MED ORDER — LORAZEPAM 2 MG/ML IJ SOLN: 1.0000 mg | Freq: Four times a day (QID) | INTRAMUSCULAR | Status: DC | PRN

## 2023-04-02 MED ORDER — SENNA 8.6 MG PO TABS: 1.0000 | ORAL_TABLET | Freq: Every evening | ORAL | Status: DC | PRN

## 2023-04-02 MED ORDER — OLANZAPINE 5 MG PO TBDP: 5.0000 mg | ORAL_TABLET | Freq: Three times a day (TID) | ORAL | Status: DC | PRN

## 2023-04-02 MED ORDER — SENNA 8.6 MG PO TABS
1.0000 | ORAL_TABLET | Freq: Every evening | ORAL | Status: DC | PRN
  Administered 2023-04-08 – 2023-04-10 (×2): 8.6 mg via ORAL
  Filled 2023-04-02: qty 1

## 2023-04-02 MED ORDER — ALUM & MAG HYDROXIDE-SIMETH 200-200-20 MG/5ML PO SUSP: 30.0000 mL | ORAL | Status: DC | PRN

## 2023-04-02 MED ORDER — HYDROXYZINE HCL 25 MG PO TABS: 25.0000 mg | ORAL_TABLET | Freq: Three times a day (TID) | ORAL | Status: DC | PRN

## 2023-04-02 MED ORDER — POLYETHYLENE GLYCOL 3350 17 G PO PACK: 17.0000 g | PACK | Freq: Every day | ORAL | Status: DC | PRN

## 2023-04-02 MED ORDER — MELATONIN 3 MG PO TABS: 3.0000 mg | ORAL_TABLET | Freq: Every evening | ORAL | Status: DC | PRN

## 2023-04-02 MED ORDER — LORAZEPAM 1 MG PO TABS: 1.0000 mg | ORAL_TABLET | Freq: Four times a day (QID) | ORAL | Status: DC | PRN

## 2023-04-02 MED ORDER — OLANZAPINE 5 MG PO TBDP: 5.0000 mg | ORAL_TABLET | Freq: Four times a day (QID) | ORAL | Status: DC | PRN

## 2023-04-02 MED ORDER — ACETAMINOPHEN 325 MG PO TABS: 650.0000 mg | ORAL_TABLET | Freq: Four times a day (QID) | ORAL | Status: DC | PRN

## 2023-04-02 MED ORDER — DIPHENHYDRAMINE HCL 25 MG PO CAPS: 25.0000 mg | ORAL_CAPSULE | Freq: Four times a day (QID) | ORAL | Status: DC | PRN

## 2023-04-02 MED ORDER — QUETIAPINE FUMARATE 50 MG PO TABS
50.0000 mg | ORAL_TABLET | Freq: Every day | ORAL | Status: DC
  Filled 2023-04-02 (×2): qty 1

## 2023-04-02 MED ORDER — OLANZAPINE 10 MG IM SOLR: 10.0000 mg | Freq: Three times a day (TID) | INTRAMUSCULAR | Status: DC | PRN

## 2023-04-02 MED ORDER — ONDANSETRON HCL 4 MG PO TABS: 8.0000 mg | ORAL_TABLET | Freq: Three times a day (TID) | ORAL | Status: DC | PRN

## 2023-04-02 MED ORDER — OLANZAPINE 10 MG IM SOLR: 5.0000 mg | Freq: Three times a day (TID) | INTRAMUSCULAR | Status: DC | PRN

## 2023-04-02 MED ORDER — BISMUTH SUBSALICYLATE 262 MG PO CHEW: 524.0000 mg | CHEWABLE_TABLET | ORAL | Status: DC | PRN

## 2023-04-02 MED ORDER — POLYETHYLENE GLYCOL 3350 17 G PO PACK
17.0000 g | PACK | Freq: Every day | ORAL | Status: DC | PRN
  Administered 2023-04-08: 17 g via ORAL
  Filled 2023-04-02: qty 1

## 2023-04-02 MED ORDER — ZIPRASIDONE MESYLATE 20 MG IM SOLR: 10.0000 mg | Freq: Four times a day (QID) | INTRAMUSCULAR | Status: DC | PRN

## 2023-04-02 NOTE — ED Provider Notes (Signed)
FBC/OBS ASAP Discharge Summary  Date: 04/02/23 Name: [name] MRN: [id number]  Discharge Diagnoses:  Final diagnoses:  Schizoaffective disorder, unspecified type (HCC)   [name] is a 42 y.o. male with a past psychiatric history of schizoaffective disorder, x1 prior psychiatric hospitalizations (at [facility] for bizarre behavior), x2 prior psychiatric urgent care / emergency department visits (both for bizarre behavior) and substance use history of alcohol and tobacco use disorder  who was admitted involuntarily as a direct admit accompanied by his probation officer from the community [facility] for stabilization and management of suicidal ideations secondary to persistent auditory hallucinations   Subjective: Patient hearing voices, stating they are "coming into my body." He also made statements such as "I am not your God. Why do you think you can make me your God?". He says he plans to kill himself by overdosing on pills if he is discharged home.  Stay Summary: During the patient's hospitalization, patient had extensive initial psychiatric evaluation, and follow-up psychiatric evaluations every day.  The following meds were provided and managed during his stay. Scheduled Meds:  QUEtiapine  100 mg Oral QHS   Continuous Infusions: PRN Meds:.acetaminophen, alum & mag hydroxide-simeth, bismuth subsalicylate, OLANZapine zydis **AND** LORazepam **AND** diphenhydrAMINE, ziprasidone **AND** LORazepam **AND** diphenhydrAMINE, hydrOXYzine, melatonin, ondansetron, polyethylene glycol, senna  Patient's care was discussed during the interdisciplinary team meeting every day during the hospitalization.  # Patient was discharged  to [facility]    Total Time spent with patient: 1.5 hours  Past Psychiatric History: schizoaffective disorder Past Medical History: asthma Family History: none Family Psychiatric History: none Social History: lives with mom, works  in Aeronautical engineer Tobacco Cessation:  A prescription for an FDA-approved tobacco cessation medication provided at discharge  Current Medications:  Current Facility-Administered Medications  Medication Dose Route Frequency Provider Last Rate Last Admin   acetaminophen (TYLENOL) tablet 650 mg  650 mg Oral Q6H PRN Augusto Gamble, MD       alum & mag hydroxide-simeth (MAALOX/MYLANTA) 200-200-20 MG/5ML suspension 30 mL  30 mL Oral Q4H PRN Augusto Gamble, MD       bismuth subsalicylate (PEPTO BISMOL) chewable tablet 524 mg  524 mg Oral Q3H PRN Augusto Gamble, MD       OLANZapine zydis (ZYPREXA) disintegrating tablet 5 mg  5 mg Oral Q6H PRN Augusto Gamble, MD       And   LORazepam (ATIVAN) tablet 1 mg  1 mg Oral Q6H PRN Augusto Gamble, MD       And   diphenhydrAMINE (BENADRYL) capsule 25 mg  25 mg Oral Q6H PRN Augusto Gamble, MD       ziprasidone (GEODON) injection 10 mg  10 mg Intramuscular Q6H PRN Augusto Gamble, MD       And   LORazepam (ATIVAN) injection 1 mg  1 mg Intramuscular Q6H PRN Augusto Gamble, MD       And   diphenhydrAMINE (BENADRYL) injection 25 mg  25 mg Intramuscular Q6H PRN Augusto Gamble, MD       hydrOXYzine (ATARAX) tablet 25 mg  25 mg Oral TID PRN Augusto Gamble, MD       melatonin tablet 3 mg  3 mg Oral QHS PRN Augusto Gamble, MD       ondansetron (ZOFRAN) tablet 8 mg  8 mg Oral Q8H PRN Augusto Gamble, MD       polyethylene glycol (MIRALAX / GLYCOLAX) packet 17 g  17 g Oral Daily PRN Augusto Gamble, MD  QUEtiapine (SEROQUEL) tablet 100 mg  100 mg Oral QHS Augusto Gamble, MD       senna [facility]) tablet 8.6 mg  1 tablet Oral QHS PRN Augusto Gamble, MD       Current Outpatient Medications  Medication Sig Dispense Refill   QUEtiapine (SEROQUEL) 100 MG tablet Take 100 mg by mouth at bedtime.      PTA Medications: (Not in a hospital admission)      06/01/2014    2:19 PM  Depression screen PHQ 2/9  Decreased Interest 0  Down, Depressed, Hopeless 0  PHQ - 2 Score 0    Flowsheet Row ED from 04/02/2023 in  [facility] ED from 03/27/2023 in [facility] Emergency Department at [facility] ED from 07/30/2022 in [facility] Emergency Department at [facility]  C-SSRS RISK CATEGORY Moderate Risk No Risk No Risk       Musculoskeletal  Strength & Muscle Tone: within normal limits Gait & Station: normal Patient leans: N/A  Psychiatric Specialty Exam  Presentation General Appearance: Casual; Disheveled   Eye Contact:Good   Speech:Clear and Coherent; Normal Rate   Speech Volume:Increased   Handedness:-- (not assessed)   Mood and Affect  Mood:No data recorded  Affect:Restricted; Inappropriate   Thought Process  Thought Processes:Disorganized   Descriptions of Associations:Loose   Orientation:Full (Time, Place and Person)   Thought Content:Paranoid Ideation   Diagnosis of Schizophrenia or Schizoaffective disorder in past: Yes    Hallucinations:Hallucinations: Auditory Description of Auditory Hallucinations: see H&P   Ideas of Reference:Delusions   Suicidal Thoughts:Suicidal Thoughts: Yes, Active SI Active Intent and/or Plan: With Intent; With Plan; With Access to Means; With Means to Carry Out   Homicidal Thoughts:No data recorded  Sensorium  Memory:Immediate Good; Recent Good; Remote Good   Judgment:Poor   Insight:Lacking   Executive Functions  Concentration:Good   Attention Span:Good   Recall:Good   Fund of Knowledge:Good   Language:Good   Psychomotor Activity  Psychomotor Activity:Psychomotor Activity: Normal   Assets  Assets:Resilience   Sleep  Sleep:Sleep: Poor   Nutritional Assessment (For OBS and FBC admissions only) Has the patient had a weight loss or gain of 10 pounds or more in the last 3 months?: No Has the patient had a decrease in food intake/or appetite?: No Does the patient have dental problems?: No Does the patient have eating habits or behaviors that may be  indicators of an eating disorder including binging or inducing vomiting?: No Has the patient recently lost weight without trying?: 0 Has the patient been eating poorly because of a decreased appetite?: 0 Malnutrition Screening Tool Score: 0    Physical Exam  Physical Exam ROS Blood pressure (!) 130/92, pulse (!) 59, temperature 98.7 F (37.1 C), temperature source Oral, resp. rate 18, SpO2 100%. There is no height or weight on file to calculate BMI.  Demographic Factors:  Male and NA  Loss Factors: Legal issues  Historical Factors: Prior suicide attempts  Risk Reduction Factors:   Living with another person, especially a relative and Positive social support  Continued Clinical Symptoms:  Currently Psychotic  Cognitive Features That Contribute To Risk:  Loss of executive function    Suicide Risk:  Severe:  Frequent, intense, and enduring suicidal ideation, specific plan, no subjective intent, but some objective markers of intent (i.e., choice of lethal method), the method is accessible, some limited preparatory behavior, evidence of impaired self-control, severe dysphoria/symptomatology, multiple risk factors present, and few if any protective factors,  particularly a lack of social support.  Plan Of Care/Follow-up recommendations:  Activity: as tolerated  Diet: heart healthy  Other: -Follow-up with your outpatient psychiatric provider -instructions on appointment date, time, and address (location) are provided to you in discharge paperwork.  -Take your psychiatric medications as prescribed at discharge - instructions are provided to you in the discharge paperwork  -Follow-up with outpatient primary care doctor and other specialists -for management of chronic medical disease, including: health maintenance checks  -Testing: Follow-up with outpatient provider for abnormal lab results: none  -Recommend abstinence from alcohol, tobacco, and other illicit drug use at  discharge.   -If your psychiatric symptoms recur, worsen, or if you have side effects to your psychiatric medications, call your outpatient psychiatric provider, 911, [phone] or go to the nearest emergency department.  -If suicidal thoughts recur, call your outpatient psychiatric provider, 911, [phone] or go to the nearest emergency department.  Disposition:  admit to [facility]  Augusto Gamble, MD 04/02/2023, 11:47 AM

## 2023-04-02 NOTE — ED Notes (Addendum)
Tried to call report to [facility] [phone], no answer. Pt is IVCed and was accepted by Dr. Tobi Bastos. Pt was transferred due extreme agitation and HI.  Pt was transported via [employer].

## 2023-04-02 NOTE — ED Provider Notes (Signed)
[place] EMERGENCY DEPARTMENT AT [facility] Provider Note   CSN: [id number] Arrival date & time: 04/02/23  1303     History  No chief complaint on file.   [name] is a 42 y.o. male.  42 yo M with a chief complaints of acute psychosis.  The patient was seen at behavioral health urgent care and felt needed hospitalization however they felt like they would have difficulty controlling him there and sent him here for evaluation.  Patient said that he has been worried about his renal function.  Said last time it was checked it was a bit elevated.  He has been having some pain in his right side that he thinks is related.  Denies trauma to the area denies difficulty breathing denies cough or congestion.  Denies fevers.  He is also worried about hallucinations especially at night when he is try to go to sleep.  He said that keeps him from sleeping a lot of the time.        Home Medications Prior to Admission medications   Medication Sig Start Date End Date Taking? Authorizing Provider  QUEtiapine (SEROQUEL) 100 MG tablet Take 100 mg by mouth at bedtime. 03/28/23   [provider]      Allergies    Pork-derived products    Review of Systems   Review of Systems  Physical Exam Updated Vital Signs There were no vitals taken for this visit. Physical Exam Vitals and nursing note reviewed.  Constitutional:      Appearance: He is well-developed.  HENT:     Head: Normocephalic and atraumatic.  Eyes:     Pupils: Pupils are equal, round, and reactive to light.  Neck:     Vascular: No JVD.  Cardiovascular:     Rate and Rhythm: Normal rate and regular rhythm.     Heart sounds: No murmur heard.    No friction rub. No gallop.  Pulmonary:     Effort: No respiratory distress.     Breath sounds: No wheezing.     Comments: Left-sided chest wall tenderness Chest:     Chest wall: Tenderness present.  Abdominal:     General: There is no distension.      Tenderness: There is no abdominal tenderness. There is no guarding or rebound.  Musculoskeletal:        General: Normal range of motion.     Cervical back: Normal range of motion and neck supple.  Skin:    Coloration: Skin is not pale.     Findings: No rash.  Neurological:     Mental Status: He is alert and oriented to person, place, and time.  Psychiatric:        Behavior: Behavior normal.     ED Results / Procedures / Treatments   Labs (all labs ordered are listed, but only abnormal results are displayed) Labs Reviewed - No data to display  EKG None  Radiology No results found.  Procedures Procedures    Medications Ordered in ED Medications - No data to display  ED Course/ Medical Decision Making/ A&P                                 Medical Decision Making Risk Decision regarding hospitalization.   42 yo M with a chief complaints of acute agitation.  The patient was seen at behavioral health, felt that he needed hospitalization but they were  concerned that he was not going to be able to comply to have blood work drawn and so sent him here for medical clearance.  Patient is calm and cooperative and following commands without issue.  Our charge nurse discussed this with the [facility] at behavioral health Hospital.  They are willing to accept him.  I feel he is medically clear.  The patients results and plan were reviewed and discussed.   Any x-rays performed were independently reviewed by myself.   Differential diagnosis were considered with the presenting HPI.  Medications - No data to display  Vitals:   04/02/23 1314 04/02/23 1318  BP:  (!) 145/104  Pulse:  85  Resp:  16  Temp:  98.9 F (37.2 C)  TempSrc:  Oral  SpO2:  97%  Weight: 90.7 kg   Height: 5\' 11"  (1.803 m)     Final diagnoses:  Hallucinations          Final Clinical Impression(s) / ED Diagnoses Final diagnoses:  None    Rx / DC Orders ED Discharge Orders     None          Melene Plan, DO 04/02/23 1328
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
## 2023-04-02 NOTE — ED Notes (Addendum)
Pt has a room at [facility]. Charge nurse spoke to MD at [facility] and they are unsure as to why he was brought here. Pt is being discharged to go to [facility] per MD.
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
## 2023-04-02 NOTE — Progress Notes (Signed)
Admission Note:  Patient is a 42 y.o. M IVC'd from [facility] for auditory hallucinations and suicidal ideations. Patient states that he hears "background conversations" and hears other people's thoughts and what other people are going through. Patient states that he has trouble sleeping and can't get any quiet time to himself. The voices in the background agree with his conversations and say the letters "O" and "A." Patient also says he experienced tactile hallucinations that he was being picked up out of bed and dragged down the hallway.   Patient currently denies SI, HI and VH. Patient denies any paranoia. Patient was calm and cooperative throughout admission process. Skin was assessed with Turi, MHT and WNL. Ankle monitor in place on right ankle. Belongings were searched per unit policy and items were secured in the locker. Consents were signed, patient was provided packet with unit routines and bill of rights. Patient was provided meal and oriented to the unit. Patient was able to make phone calls. Q 15 minute safety checks were initiated.

## 2023-04-02 NOTE — ED Triage Notes (Signed)
Pt coming from [facility]. IVC.

## 2023-04-02 NOTE — ED Notes (Signed)
This Clinical research associate received a follow-up from [facility].  Pt was sent to ED because he refused blood work at [facility].  [facility] AC requested we draw a CBC and CMP prior to the Pt being transferred to [facility].  She sts we do not need to wait on results.

## 2023-04-02 NOTE — Progress Notes (Signed)
Pt has been accepted to [facility] on 04/02/2023 Bed assignment: 507-1   Pt meets inpatient criteria per: Augusto Gamble MD   Attending Physician will be: Phineas Inches, MD   Report can be called to:Adult unit: [phone]  Pt can arrive after  VS, EKG, and vol   Care Team Notified: [facility] AC: Rona Ravens RN, Augusto Gamble MD Nelly Rout MD Karn Pickler   [place] Dontavius Keim LCSW-A   04/02/2023 11:26 AM
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
## 2023-04-02 NOTE — Plan of Care (Signed)
  Problem: Education: Goal: Emotional status will improve Outcome: Progressing Goal: Mental status will improve Outcome: Progressing Goal: Verbalization of understanding the information provided will improve Outcome: Progressing  Patient refused hs Seroquel stated he wants 50 mg not 100 mg On call Provider notified. New order or Seroquel 50 mg in place. Support and encouragement provided.

## 2023-04-02 NOTE — ED Notes (Signed)
This Clinical research associate spoke to [name], [facility] AC. She confirmed the Pt has had a bed ready at [facility] and she is unsure why he was brought to the ED.  EDP informed and has been to the bedside to perform MSE.  [facility] AC reports [facility] should be calling report to their Adult Unit.  AC informed to call this writer back if they need anything from [place].

## 2023-04-02 NOTE — ED Triage Notes (Signed)
Pt brought in by [employer] due to being on IVC paperwork from [facility]. IVC paperwork states, "hearing voices and feeling suicidal... making bizarre statements... and threatened to kill himself by overdosing on his prescribed quetiapine when he leaves the hospital."  Had room at [facility] but was brought here because he was too aggressive and agitated. Pt was calm and cooperative for this nurse.

## 2023-04-02 NOTE — Progress Notes (Signed)
   04/02/23 2300  Psych Admission Type (Psych Patients Only)  Admission Status Involuntary  Psychosocial Assessment  Patient Complaints Depression  Eye Contact Fair  Facial Expression Animated;Sad  Affect Preoccupied  Speech Logical/coherent  Interaction Assertive  Motor Activity Slow  Appearance/Hygiene In scrubs  Behavior Characteristics Cooperative;Calm  Mood Depressed  Thought Process  Coherency WDL  Content WDL  Delusions None reported or observed  Perception Hallucinations  Hallucination Auditory  Judgment WDL  Confusion WDL  Danger to Self  Current suicidal ideation? Denies (Denies)  Danger to Others  Danger to Others None reported or observed

## 2023-04-02 NOTE — Plan of Care (Signed)
  Problem: Education: Goal: Knowledge of [facility] Education information/materials will improve Outcome: Progressing Goal: Emotional status will improve Outcome: Progressing Goal: Mental status will improve Outcome: Progressing Goal: Verbalization of understanding the information provided will improve Outcome: Progressing   

## 2023-04-02 NOTE — Tx Team (Signed)
Initial Treatment Plan 04/02/2023 3:11 PM Chalmers Poyser [id number]    PATIENT STRESSORS: Health problems   Medication change or noncompliance   Other: auditory hallucinations     PATIENT STRENGTHS: Capable of independent living  Communication skills  Motivation for treatment/growth  Supportive family/friends  Work skills    PATIENT IDENTIFIED PROBLEMS: "I felt like giving up today" "I hear other people's thoughts and they try to come out of my throat"  "It's hard to sleep and I get no quiet time to myself"                   DISCHARGE CRITERIA:  Ability to meet basic life and health needs Improved stabilization in mood, thinking, and/or behavior Motivation to continue treatment in a less acute level of care  PRELIMINARY DISCHARGE PLAN: Attend aftercare/continuing care group Return to previous living arrangement Return to previous work or school arrangements  PATIENT/FAMILY INVOLVEMENT: This treatment plan has been presented to and reviewed with the patient, [name].  The patient has been given the opportunity to ask questions and make suggestions.  Karn Pickler, RN 04/02/2023, 3:11 PM

## 2023-04-03 ENCOUNTER — Encounter (HOSPITAL_COMMUNITY): Payer: Self-pay

## 2023-04-03 DIAGNOSIS — F250 Schizoaffective disorder, bipolar type: Secondary | ICD-10-CM | POA: Diagnosis not present

## 2023-04-03 MED ORDER — HALOPERIDOL 5 MG PO TABS: 5.0000 mg | ORAL_TABLET | Freq: Three times a day (TID) | ORAL | Status: DC | PRN

## 2023-04-03 MED ORDER — DIPHENHYDRAMINE HCL 50 MG/ML IJ SOLN: 50.0000 mg | Freq: Three times a day (TID) | INTRAMUSCULAR | Status: DC | PRN

## 2023-04-03 MED ORDER — OLANZAPINE 10 MG PO TBDP
10.0000 mg | ORAL_TABLET | Freq: Two times a day (BID) | ORAL | Status: DC
  Filled 2023-04-03 (×6): qty 1

## 2023-04-03 MED ORDER — HALOPERIDOL LACTATE 5 MG/ML IJ SOLN
10.0000 mg | Freq: Three times a day (TID) | INTRAMUSCULAR | Status: DC | PRN
  Filled 2023-04-03: qty 2

## 2023-04-03 MED ORDER — LORAZEPAM 2 MG/ML IJ SOLN: 2.0000 mg | Freq: Three times a day (TID) | INTRAMUSCULAR | Status: DC | PRN

## 2023-04-03 MED ORDER — DIPHENHYDRAMINE HCL 25 MG PO CAPS
50.0000 mg | ORAL_CAPSULE | Freq: Three times a day (TID) | ORAL | Status: DC | PRN
  Administered 2023-04-03: 50 mg via ORAL
  Filled 2023-04-03: qty 2

## 2023-04-03 MED ORDER — NICOTINE 21 MG/24HR TD PT24
21.0000 mg | MEDICATED_PATCH | Freq: Every day | TRANSDERMAL | Status: DC
  Filled 2023-04-03 (×12): qty 1

## 2023-04-03 MED ORDER — LORAZEPAM 1 MG PO TABS: 2.0000 mg | ORAL_TABLET | Freq: Three times a day (TID) | ORAL | Status: DC | PRN

## 2023-04-03 MED ORDER — HALOPERIDOL LACTATE 5 MG/ML IJ SOLN: 5.0000 mg | Freq: Three times a day (TID) | INTRAMUSCULAR | Status: DC | PRN

## 2023-04-03 MED ORDER — DIPHENHYDRAMINE HCL 25 MG PO CAPS: 50.0000 mg | ORAL_CAPSULE | Freq: Three times a day (TID) | ORAL | Status: DC | PRN

## 2023-04-03 MED ORDER — NICOTINE POLACRILEX 2 MG MT GUM: 2.0000 mg | CHEWING_GUM | OROMUCOSAL | Status: DC | PRN

## 2023-04-03 MED ORDER — HYDROXYZINE HCL 50 MG PO TABS
50.0000 mg | ORAL_TABLET | Freq: Three times a day (TID) | ORAL | Status: DC | PRN
  Administered 2023-04-05: 50 mg via ORAL
  Filled 2023-04-03: qty 20
  Filled 2023-04-03: qty 1

## 2023-04-03 MED ORDER — HALOPERIDOL 5 MG PO TABS
5.0000 mg | ORAL_TABLET | Freq: Three times a day (TID) | ORAL | Status: DC | PRN
  Administered 2023-04-03: 5 mg via ORAL
  Filled 2023-04-03 (×2): qty 1

## 2023-04-03 MED ORDER — TRAZODONE HCL 50 MG PO TABS: 50.0000 mg | ORAL_TABLET | Freq: Every evening | ORAL | Status: DC | PRN

## 2023-04-03 NOTE — [facility]HH Suicide Risk Assessment (Signed)
[facility] Admission Suicide Risk Assessment   Nursing information obtained from:    Demographic factors:  Male Current Mental Status:  Suicidal ideation indicated by patient Loss Factors:  Decline in physical health Historical Factors:  NA Risk Reduction Factors:  Responsible for children under 40 years of age, Employed, Sense of responsibility to family  Total Time spent with patient: 30 minutes Principal Problem: Schizoaffective disorder, bipolar type (HCC) Diagnosis:  Principal Problem:   Schizoaffective disorder, bipolar type (HCC)  Subjective Data: Patient reports having suicidal thoughts for quite some time s Intake.  He reports having suicidal thoughts at the time of the evaluation with intent and plan but no means, to overdose.  Denies any HI.  Reports and auditory and visual hallucinations and also reports having paranoia.  Been off meds for quite some time.  Reports being very depressed.  Reports being impulsive.  Reports has no reason to live.  Continued Clinical Symptoms:  Alcohol Use Disorder Identification Test Final Score (AUDIT): 1 The "Alcohol Use Disorders Identification Test", Guidelines for Use in Primary Care, Second Edition.  World Science writer [facility]). Score between 0-7:  no or low risk or alcohol related problems. Score between 8-15:  moderate risk of alcohol related problems. Score between 16-19:  high risk of alcohol related problems. Score 20 or above:  warrants further diagnostic evaluation for alcohol dependence and treatment.   CLINICAL FACTORS:   Bipolar Disorder:   Depressive phase Schizophrenia:   Depressive state Paranoid or undifferentiated type More than one psychiatric diagnosis Currently Psychotic Unstable or Poor Therapeutic Relationship Previous Psychiatric Diagnoses and Treatments   Psychiatric Specialty Exam:  Presentation  General Appearance:  Disheveled  Eye Contact: Poor  Speech: Normal Rate  Speech Volume: -- (loud,  demanding)  Handedness: -- (not assessed)   Mood and Affect  Mood: Irritable  Affect: Restricted   Thought Process  Thought Processes: Linear  Descriptions of Associations:Intact  Orientation:Full (Time, Place and Person)  Thought Content:Paranoid Ideation  History of Schizophrenia/Schizoaffective disorder:Yes  Duration of Psychotic Symptoms:Greater than six months  Hallucinations:Hallucinations: Auditory; Visual Description of Auditory Hallucinations: see H&P  Ideas of Reference:Paranoia  Suicidal Thoughts:Suicidal Thoughts: Yes, Active SI Active Intent and/or Plan: With Plan; Without Means to Carry Out; With Intent  Homicidal Thoughts:Homicidal Thoughts: No   Sensorium  Memory: Immediate Fair; Recent Poor; Remote Fair  Judgment: Impaired  Insight: Lacking   Executive Functions  Concentration: Poor  Attention Span: Fair  Recall: Good  Fund of Knowledge: Good  Language: Good   Psychomotor Activity  Psychomotor Activity: Psychomotor Activity: Restlessness   Assets  Assets: Resilience   Sleep  Sleep: Sleep: Poor    Physical Exam: Physical Exam see H&P ROS see H&P Blood pressure 118/87, pulse 60, temperature 98 F (36.7 C), temperature source Oral, resp. rate 18, height 5\' 11"  (1.803 m), weight 90.7 kg. Body mass index is 27.89 kg/m.   COGNITIVE FEATURES THAT CONTRIBUTE TO RISK:  None    SUICIDE RISK:   Severe:  Frequent, intense, and enduring suicidal ideation, specific plan, subjective intent, but some objective markers of intent (i.e., choice of lethal method), the method is not accessible, evidence of impaired self-control, severe dysphoria/symptomatology, multiple risk factors present, and few if any protective factors, particularly a lack of social support.  PLAN OF CARE: See H&P  I certify that inpatient services furnished can reasonably be expected to improve the patient's condition.   Phineas Inches,  MD 04/03/2023, 12:53 PM

## 2023-04-03 NOTE — Group Note (Signed)
Recreation Therapy Group Note   Group Topic:Stress Management  Group Date: 04/03/2023 Start Time: 1035 End Time: 1055 Facilitators: Kalise Fickett-McCall, LRT,CTRS Location: [address] Hall Dayroom   Group Topic: Stress Management  Goal Area(s) Addresses:  Patient will identify positive stress management techniques. Patient will identify benefits of using stress management post d/c.  Group Description: Meditation. LRT and patients discussed meditation and how it can be effective. LRT played a meditation that focused on finding your true self. LRT informed patients they could find meditations and other stress management techniques through Youtube, apps, internet, etc.   Education:  Stress Management, Discharge Planning.   Education Outcome: Acknowledges Education   Affect/Mood: N/A   Participation Level: Did not attend    Clinical Observations/Individualized Feedback:      Plan: Continue to engage patient in RT group sessions 2-3x/week.   Adiah Guereca-McCall, LRT,CTRS 04/03/2023 12:51 PM
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
## 2023-04-03 NOTE — Progress Notes (Signed)
Recreation Therapy Notes  INPATIENT RECREATION THERAPY ASSESSMENT  Patient Details Name: [name] MRN: [id number] DOB: 12/05/1980 Today's Date: 04/03/2023       Information Obtained From: Patient  Able to Participate in Assessment/Interview: Yes (Pt stated he has no plans of doing any activities because it makes you feel like you're at home and "this ain't my home and I don't plan on getting comfortable here".)  Patient Presentation: Alert  Reason for Admission (Per Patient): Other (Comments) (UTA)  Patient Stressors: Other (Comment) (UTA)  Coping Skills:   Music ("sometimes because I can hear things")  Leisure Interests (2+):   ("none")  Expressed Interest in [employer] Information:  (UTA)  [place] of Residence:  [place]  Patient Main Form of Transportation: Other (Comment) (UTA)  Patient Strengths:  "working"  Patient Identified Areas of Improvement:  "walking out these doors"  Patient Goal for Hospitalization:  UTA  Current SI (including self-harm):  No  Current HI:  No  Current AVH: No  Staff Intervention Plan: Group Attendance, Collaborate with Interdisciplinary Treatment Team  Consent to Intern Participation: N/A   Sander Remedios-McCall, LRT,CTRS 04/03/2023, 1:03 PM

## 2023-04-03 NOTE — Progress Notes (Signed)
   04/03/23 0840  Psych Admission Type (Psych Patients Only)  Admission Status Involuntary  Psychosocial Assessment  Patient Complaints Depression  Eye Contact Fair  Facial Expression Sad  Affect Depressed;Sad  Speech Logical/coherent  Interaction Minimal  Motor Activity Slow  Appearance/Hygiene In scrubs  Behavior Characteristics Cooperative;Calm  Mood Depressed  Thought Process  Coherency WDL  Content WDL  Delusions None reported or observed  Perception Hallucinations  Hallucination Auditory  Judgment Poor  Confusion None  Danger to Self  Current suicidal ideation? Passive  Agreement Not to Harm Self Yes  Description of Agreement Verbal  Danger to Others  Danger to Others None reported or observed

## 2023-04-03 NOTE — Plan of Care (Signed)
°  Problem: Education: °Goal: Emotional status will improve °Outcome: Progressing °Goal: Mental status will improve °Outcome: Progressing °Goal: Verbalization of understanding the information provided will improve °Outcome: Progressing °  °

## 2023-04-03 NOTE — BH IP Treatment Plan (Signed)
Interdisciplinary Treatment and Diagnostic Plan Update  04/03/2023 Time of Session: 10:50AM [name] MRN: [id number]  Principal Diagnosis: Schizoaffective disorder, bipolar type [facility])  Secondary Diagnoses: Principal Problem:   Schizoaffective disorder, bipolar type (HCC)   Current Medications:  Current Facility-Administered Medications  Medication Dose Route Frequency Provider Last Rate Last Admin   acetaminophen (TYLENOL) tablet 650 mg  650 mg Oral Q6H PRN Augusto Gamble, MD       alum & mag hydroxide-simeth (MAALOX/MYLANTA) 200-200-20 MG/5ML suspension 30 mL  30 mL Oral Q4H PRN Augusto Gamble, MD       bismuth subsalicylate (PEPTO BISMOL) chewable tablet 524 mg  524 mg Oral Q3H PRN Augusto Gamble, MD       haloperidol (HALDOL) tablet 5 mg  5 mg Oral TID PRN Phineas Inches, MD   5 mg at 04/03/23 1330   And   diphenhydrAMINE (BENADRYL) capsule 50 mg  50 mg Oral TID PRN Phineas Inches, MD   50 mg at 04/03/23 1330   haloperidol lactate (HALDOL) injection 5 mg  5 mg Intramuscular TID PRN Massengill, Harrold Donath, MD       And   diphenhydrAMINE (BENADRYL) injection 50 mg  50 mg Intramuscular TID PRN Massengill, Harrold Donath, MD       And   LORazepam (ATIVAN) injection 2 mg  2 mg Intramuscular TID PRN Massengill, Harrold Donath, MD       haloperidol lactate (HALDOL) injection 10 mg  10 mg Intramuscular TID PRN Massengill, Harrold Donath, MD       And   diphenhydrAMINE (BENADRYL) injection 50 mg  50 mg Intramuscular TID PRN Massengill, Harrold Donath, MD       And   LORazepam (ATIVAN) injection 2 mg  2 mg Intramuscular TID PRN Massengill, Harrold Donath, MD       hydrOXYzine (ATARAX) tablet 50 mg  50 mg Oral TID PRN Massengill, Harrold Donath, MD       nicotine (NICODERM CQ - dosed in mg/24 hours) patch 21 mg  21 mg Transdermal Daily Massengill, Nathan, MD       nicotine polacrilex (NICORETTE) gum 2 mg  2 mg Oral PRN Massengill, Harrold Donath, MD       OLANZapine zydis (ZYPREXA) disintegrating tablet 10 mg  10 mg Oral Q12H Massengill,  Nathan, MD       ondansetron (ZOFRAN) tablet 8 mg  8 mg Oral Q8H PRN Augusto Gamble, MD       polyethylene glycol (MIRALAX / GLYCOLAX) packet 17 g  17 g Oral Daily PRN Augusto Gamble, MD       senna (SENOKOT) tablet 8.6 mg  1 tablet Oral QHS PRN Augusto Gamble, MD       traZODone (DESYREL) tablet 50 mg  50 mg Oral QHS PRN Massengill, Harrold Donath, MD       PTA Medications: Medications Prior to Admission  Medication Sig Dispense Refill Last Dose   QUEtiapine (SEROQUEL) 100 MG tablet Take 100 mg by mouth at bedtime.       Patient Stressors: Health problems   Medication change or noncompliance   Other: auditory hallucinations    Patient Strengths: Capable of independent living  Manufacturing systems engineer  Motivation for treatment/growth  Supportive family/friends  Work skills   Treatment Modalities: Medication Management, Group therapy, Case management,  1 to 1 session with clinician, Psychoeducation, Recreational therapy.   Physician Treatment Plan for Primary Diagnosis: Schizoaffective disorder, bipolar type (HCC) Long Term Goal(s): Improvement in symptoms so as ready for discharge   Short Term Goals: Ability to identify  changes in lifestyle to reduce recurrence of condition will improve Ability to verbalize feelings will improve Ability to disclose and discuss suicidal ideas Ability to demonstrate self-control will improve Ability to identify and develop effective coping behaviors will improve Ability to maintain clinical measurements within normal limits will improve Compliance with prescribed medications will improve Ability to identify triggers associated with substance abuse/mental health issues will improve  Medication Management: Evaluate patient's response, side effects, and tolerance of medication regimen.  Therapeutic Interventions: 1 to 1 sessions, Unit Group sessions and Medication administration.  Evaluation of Outcomes: Not Progressing  Physician Treatment Plan for Secondary  Diagnosis: Principal Problem:   Schizoaffective disorder, bipolar type (HCC)  Long Term Goal(s): Improvement in symptoms so as ready for discharge   Short Term Goals: Ability to identify changes in lifestyle to reduce recurrence of condition will improve Ability to verbalize feelings will improve Ability to disclose and discuss suicidal ideas Ability to demonstrate self-control will improve Ability to identify and develop effective coping behaviors will improve Ability to maintain clinical measurements within normal limits will improve Compliance with prescribed medications will improve Ability to identify triggers associated with substance abuse/mental health issues will improve     Medication Management: Evaluate patient's response, side effects, and tolerance of medication regimen.  Therapeutic Interventions: 1 to 1 sessions, Unit Group sessions and Medication administration.  Evaluation of Outcomes: Not Progressing   RN Treatment Plan for Primary Diagnosis: Schizoaffective disorder, bipolar type (HCC) Long Term Goal(s): Knowledge of disease and therapeutic regimen to maintain health will improve  Short Term Goals: Ability to remain free from injury will improve, Ability to verbalize frustration and anger appropriately will improve, Ability to demonstrate self-control, Ability to participate in decision making will improve, Ability to verbalize feelings will improve, Ability to disclose and discuss suicidal ideas, Ability to identify and develop effective coping behaviors will improve, and Compliance with prescribed medications will improve  Medication Management: RN will administer medications as ordered by provider, will assess and evaluate patient's response and provide education to patient for prescribed medication. RN will report any adverse and/or side effects to prescribing provider.  Therapeutic Interventions: 1 on 1 counseling sessions, Psychoeducation, Medication  administration, Evaluate responses to treatment, Monitor vital signs and CBGs as ordered, Perform/monitor CIWA, COWS, AIMS and Fall Risk screenings as ordered, Perform wound care treatments as ordered.  Evaluation of Outcomes: Not Progressing   LCSW Treatment Plan for Primary Diagnosis: Schizoaffective disorder, bipolar type (HCC) Long Term Goal(s): Safe transition to appropriate next level of care at discharge, Engage patient in therapeutic group addressing interpersonal concerns.  Short Term Goals: Engage patient in aftercare planning with referrals and resources, Increase social support, Increase ability to appropriately verbalize feelings, Increase emotional regulation, Facilitate acceptance of mental health diagnosis and concerns, Facilitate patient progression through stages of change regarding substance use diagnoses and concerns, Identify triggers associated with mental health/substance abuse issues, and Increase skills for wellness and recovery  Therapeutic Interventions: Assess for all discharge needs, 1 to 1 time with Social worker, Explore available resources and support systems, Assess for adequacy in community support network, Educate family and significant other(s) on suicide prevention, Complete Psychosocial Assessment, Interpersonal group therapy.  Evaluation of Outcomes: Not Progressing   Progress in Treatment: Attending groups: No. Participating in groups: No. Taking medication as prescribed: No. Toleration medication: No. Family/Significant other contact made: No, will contact:  consent pending Patient understands diagnosis: No. Discussing patient identified problems/goals with staff: Yes. Medical problems stabilized or resolved: Yes. Denies  suicidal/homicidal ideation: Yes. Issues/concerns per patient self-inventory: No.  New problem(s) identified: No, Describe:  none reported  New Short Term/Long Term Goal(s): medication stabilization, elimination of SI thoughts,  development of comprehensive mental wellness plan.    Patient Goals:  "Get discharged"  Discharge Plan or Barriers: Patient recently admitted. CSW will continue to follow and assess for appropriate referrals and possible discharge planning.    Reason for Continuation of Hospitalization: Hallucinations Medication stabilization  Estimated Length of Stay: 5-7 days  Last 3 [place] Suicide Severity Risk Score: Flowsheet Row [facility] (Current[facility] Most recent reading at 04/02/2023  2:00 PM ED from 04/02/2023 in [facility] Emergency Department [facility] Most recent reading at 04/02/2023  1:15 PM ED from 04/02/2023 in [facility] Most recent reading at 04/02/2023  8:21 AM  C-SSRS RISK CATEGORY High Risk High Risk Moderate Risk       Last PHQ 2/9 Scores:    06/01/2014    2:19 PM  Depression screen PHQ 2/9  Decreased Interest 0  Down, Depressed, Hopeless 0  PHQ - 2 Score 0    Scribe for Treatment Team: Esmeralda Arthur 04/03/2023 2:33 PM

## 2023-04-03 NOTE — BHH Suicide Risk Assessment (Signed)
[facility] INPATIENT:  Family/Significant Other Suicide Prevention Education  Suicide Prevention Education:  Education Completed; Clydie Braun [phone]  (name of family member/significant other) has been identified by the patient as the family member/significant other with whom the patient will be residing, and identified as the person(s) who will aid the patient in the event of a mental health crisis (suicidal ideations/suicide attempt).  With written consent from the patient, the family member/significant other has been provided the following suicide prevention education, prior to the and/or following the discharge of the patient.  CSW received consent to speak with pt's mother who reported she has no safety concerns surrounding pt returning home. She has no guns in the home, " I do not believe in them and do not want them around" Mother reported she will secure knives and sharp objects. Mother reported she would like to know more about pt's medications and if they are going to be changed.  Mother reported pt was incarcerated for 27 months due to having a gun in his possession. Mother reported pt currently has a Engineer, drilling and she has been in contact.  Mother reported pt is easily agitated. Mother reported that she has been in contact with pt's job and they understand the circumstance, pt's job is secure. CSW shared that we are unsure of discharge date however will keep her posted of progress.  The suicide prevention education provided includes the following: Suicide risk factors Suicide prevention and interventions National Suicide Hotline telephone number [facility] assessment telephone number [facility] Emergency Assistance 911 [facility] and/or Residential Mobile Crisis Unit telephone number  Request made of family/significant other to: Remove weapons (e.g., guns, rifles, knives), all items previously/currently identified as safety concern.   Remove drugs/medications  (over-the-counter, prescriptions, illicit drugs), all items previously/currently identified as a safety concern.  The family member/significant other verbalizes understanding of the suicide prevention education information provided.  The family member/significant other agrees to remove the items of safety concern listed above.  Ericia Moxley R 04/03/2023, 2:20 PM

## 2023-04-03 NOTE — Progress Notes (Signed)
[name] ([place] Probation Officer) presented to lobby off [facility] on evening of 11/19. USPO is requesting he be notified if ankle monitor begins to beep or die entirely. Would also like to be notified with a discharge date and requests to be contacted before discharge occurs. Cell: [phone]. Office: [phone].

## 2023-04-03 NOTE — Progress Notes (Signed)
1330 patient received PO agitation protocol (haloperidol 5mg  and diphenhydramine 50mg ) for urinating on floor outside of room and throwing lotion on the floor outside of room irritable and responding to internal stimuli.

## 2023-04-04 DIAGNOSIS — F250 Schizoaffective disorder, bipolar type: Secondary | ICD-10-CM | POA: Diagnosis not present

## 2023-04-04 LAB — LIPID PANEL
Cholesterol: 112 mg/dL (ref 0–200)
HDL: 39 mg/dL — ABNORMAL LOW (ref >40)
LDL Cholesterol: 66 mg/dL (ref 0–99)
Total CHOL/HDL Ratio: 2.9 {ratio}
Triglycerides: 36 mg/dL (ref <150)
VLDL: 7 mg/dL (ref 0–40)

## 2023-04-04 LAB — HEMOGLOBIN A1C
Hgb A1c MFr Bld: 5.5 % (ref 4.8–5.6)
Mean Plasma Glucose: 111.15 mg/dL

## 2023-04-04 LAB — TSH: TSH: 1.831 u[IU]/mL (ref 0.350–4.500)

## 2023-04-04 MED ORDER — BENZTROPINE MESYLATE 0.5 MG PO TABS
0.5000 mg | ORAL_TABLET | Freq: Two times a day (BID) | ORAL | Status: DC
  Administered 2023-04-04 – 2023-04-10 (×13): 0.5 mg via ORAL
  Filled 2023-04-04 (×6): qty 1
  Filled 2023-04-04: qty 28
  Filled 2023-04-04 (×7): qty 1
  Filled 2023-04-04: qty 28
  Filled 2023-04-04 (×5): qty 1

## 2023-04-04 MED ORDER — HALOPERIDOL 5 MG PO TABS
5.0000 mg | ORAL_TABLET | ORAL | Status: AC
  Administered 2023-04-04: 5 mg via ORAL
  Filled 2023-04-04: qty 1

## 2023-04-04 MED ORDER — HALOPERIDOL 5 MG PO TABS
5.0000 mg | ORAL_TABLET | Freq: Two times a day (BID) | ORAL | Status: DC
  Administered 2023-04-04: 5 mg via ORAL
  Filled 2023-04-04 (×7): qty 1

## 2023-04-04 MED ORDER — HALOPERIDOL LACTATE 5 MG/ML IJ SOLN
5.0000 mg | Freq: Two times a day (BID) | INTRAMUSCULAR | Status: DC
  Filled 2023-04-04 (×6): qty 1
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
## 2023-04-04 NOTE — Progress Notes (Signed)
Patient refused his am  zyprexa, stating it makes him gain weight. MD notified of patient refusal, and per MD, patient has been offered other medication, however,  he refused other options offered to him. No acute distress noted at this time. Staff will continue to provide support to patient.

## 2023-04-04 NOTE — Progress Notes (Signed)
   04/04/23 0823  Psych Admission Type (Psych Patients Only)  Admission Status Involuntary  Psychosocial Assessment  Patient Complaints Depression  Eye Contact Fair  Facial Expression Animated;Sad  Affect Preoccupied  Speech Logical/coherent  Interaction Assertive  Motor Activity Slow  Appearance/Hygiene In scrubs  Behavior Characteristics Cooperative;Appropriate to situation  Mood Depressed  Thought Process  Coherency WDL  Content WDL  Delusions WDL  Perception Hallucinations  Hallucination Auditory  Judgment WDL  Confusion WDL  Danger to Self  Current suicidal ideation? Denies  Agreement Not to Harm Self Yes  Description of Agreement verbal  Danger to Others  Danger to Others None reported or observed

## 2023-04-04 NOTE — Plan of Care (Signed)

## 2023-04-04 NOTE — Plan of Care (Signed)
  Problem: Education: Goal: Emotional status will improve Outcome: Progressing Goal: Mental status will improve Outcome: Progressing  Patient refused HS Zyprexa stated " I took it before it makes me gain weight I just started Seroquel and that's what I want to take. Patient educated on his medications. Provider notified no new orders. Patient has been calm this shift. Support provided as needed.

## 2023-04-04 NOTE — Group Note (Signed)
Recreation Therapy Group Note   Group Topic:Relaxation  Group Date: 04/04/2023 Start Time: 1009 End Time: 1048 Facilitators: Karter Haire-McCall, LRT,CTRS Location: [address] Dayroom   Group Topic: Relaxation  Goal Area(s) Addresses:  Patient will verbalize the benefits of music in relaxation. Patient will verbalize benefit of relaxation as a whole.  Intervention: Music App, Speaker  Activity:  LRT and patients discussed the importance of music and how it can be used for relaxation. Patients were then given to request the songs of their choosing. Patients would then explain the meaning of those songs to them.   Education: Relaxation, Discharge Planning.   Education Outcome: Acknowledges education/In group clarification offered/Needs additional education.    Affect/Mood: N/A   Participation Level: Did not attend    Clinical Observations/Individualized Feedback:     Plan: Continue to engage patient in RT group sessions 2-3x/week.   [name], LRT,CTRS  04/04/2023 11:46 AM

## 2023-04-04 NOTE — [facility] Group Notes (Signed)
[facility] Group Notes:  (Nursing/MHT/Case Management/Adjunct)  Date:  04/04/2023  Time:  8:46 PM  Type of Therapy:  Psychoeducational Skills  Participation Level:  Did Not Attend  Participation Quality:  Resistant  Affect:  Resistant  Cognitive:  Lacking  Insight:  None  Engagement in Group:  None  Modes of Intervention:  Education  Summary of Progress/Problems: The patient did not attend group this evening.   Hazle Coca S 04/04/2023, 8:46 PM

## 2023-04-04 NOTE — Group Note (Unsigned)
Date:  04/04/2023 Time:  11:38 AM  Group Topic/Focus:  Goals Group:   The focus of this group is to help patients establish daily goals to achieve during treatment and discuss how the patient can incorporate goal setting into their daily lives to aide in recovery. Orientation:   The focus of this group is to educate the patient on the purpose and policies of crisis stabilization and provide a format to answer questions about their admission.  The group details unit policies and expectations of patients while admitted.     Participation Level:  {[facility] PARTICIPATION OZHYQ:65784}  Participation Quality:  {[facility] PARTICIPATION QUALITY:22265}  Affect:  {[facility] AFFECT:22266}  Cognitive:  {[facility] COGNITIVE:22267}  Insight: {[facility] Insight2:20797}  Engagement in Group:  {[facility] ENGAGEMENT IN ONGEX:52841}  Modes of Intervention:  {[facility] MODES OF INTERVENTION:22269}  Additional Comments:  ***  [name] T [name] 04/04/2023, 11:38 AM

## 2023-04-04 NOTE — Progress Notes (Signed)
[facility] Second Physician Opinion Progress Note for Medication Administration to Non-consenting Patients (For Involuntarily Committed Patients)  Patient: [name] Date of Birth: [birth year] MRN: [id number]  Reason for the Medication: The patient, without the benefit of the specific treatment measure, is incapable of participating in any available treatment plan that will give the patient a realistic opportunity of improving the patient's condition. There is, without the benefit of the specific treatment measure, a significant possibility that the patient will harm self or others before improvement of the patient's condition is realized.  Consideration of Side Effects: Consideration of the side effects related to the medication plan has been given.  Rationale for Medication Administration: 42 yr old male who is actively hallucinating and extremely irritable. He is also paranoid and suicidal. He refuses medications and is not improving.  He remains labile and combative and has no clear rationale to refuse all medications other than potential side effects on Zyprexa.  He was offered other alternative medications but he refused them.  He has a history of a positive response to treatment in the past justifying medications over objection.    Rex Kras, MD 04/04/23  8:29 AM   This documentation is good for (7) seven days from the date of the MD signature. New documentation must be completed every seven (7) days with detailed justification in the medical record if the patient requires continued non-emergent administration of psychotropic medications.

## 2023-04-05 DIAGNOSIS — F250 Schizoaffective disorder, bipolar type: Secondary | ICD-10-CM | POA: Diagnosis not present

## 2023-04-05 MED ORDER — HALOPERIDOL 5 MG PO TABS
10.0000 mg | ORAL_TABLET | Freq: Two times a day (BID) | ORAL | Status: DC
  Administered 2023-04-05 – 2023-04-10 (×10): 10 mg via ORAL
  Filled 2023-04-05 (×4): qty 2
  Filled 2023-04-05: qty 56
  Filled 2023-04-05 (×5): qty 2
  Filled 2023-04-05: qty 56
  Filled 2023-04-05 (×4): qty 2

## 2023-04-05 MED ORDER — HALOPERIDOL LACTATE 5 MG/ML IJ SOLN
5.0000 mg | Freq: Two times a day (BID) | INTRAMUSCULAR | Status: DC
  Filled 2023-04-05 (×15): qty 1

## 2023-04-05 NOTE — [facility] Group Notes (Signed)

## 2023-04-05 NOTE — Plan of Care (Signed)

## 2023-04-05 NOTE — Progress Notes (Signed)
   04/05/23 0800  Psych Admission Type (Psych Patients Only)  Admission Status Involuntary  Psychosocial Assessment  Patient Complaints Depression  Eye Contact Fair  Facial Expression Animated  Affect Appropriate to circumstance  Speech Logical/coherent  Interaction Assertive  Motor Activity Slow  Appearance/Hygiene In scrubs  Behavior Characteristics Cooperative;Appropriate to situation  Mood Depressed  Thought Process  Coherency WDL  Content WDL  Delusions None reported or observed  Perception WDL  Hallucination Auditory  Judgment Poor  Confusion None  Danger to Self  Current suicidal ideation? Denies  Self-Injurious Behavior No self-injurious ideation or behavior indicators observed or expressed   Agreement Not to Harm Self Yes  Description of Agreement Verbal  Danger to Others  Danger to Others None reported or observed

## 2023-04-05 NOTE — Progress Notes (Signed)
   04/05/23 0000  Psych Admission Type (Psych Patients Only)  Admission Status Involuntary  Psychosocial Assessment  Patient Complaints Depression  Eye Contact Fair  Facial Expression Animated  Affect Appropriate to circumstance  Speech Logical/coherent  Interaction Assertive  Motor Activity Slow  Appearance/Hygiene In scrubs  Behavior Characteristics Cooperative;Appropriate to situation  Mood Depressed  Thought Process  Coherency WDL  Content WDL  Delusions None reported or observed  Perception Hallucinations  Hallucination Auditory  Judgment Poor  Confusion None  Danger to Self  Current suicidal ideation? Denies  Self-Injurious Behavior No self-injurious ideation or behavior indicators observed or expressed   Agreement Not to Harm Self Yes  Description of Agreement verbal  Danger to Others  Danger to Others None reported or observed

## 2023-04-05 NOTE — Progress Notes (Signed)
Pt woke up complaining of voices commanding him to kill himself. Pt stated he is tied of the voices, he just wants to go home and end it all. Pt stated medication is making it worse, after alt of encouragement, pt agreed to vistaril 50 mg PO. Pt contracted verbally contracted for safety, will continue to monitor.
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
## 2023-04-05 NOTE — [facility] Group Notes (Signed)
Adult Psychoeducational Group Note  Date:  04/05/2023 Time:  10:36 AM  Group Topic/Focus:  Goals Group:   The focus of this group is to help patients establish daily goals to achieve during treatment and discuss how the patient can incorporate goal setting into their daily lives to aide in recovery.  Participation Level:  na  Participation Quality:na    Affect:  na  Cognitive:  na  Insight: na  Engagement in Group:  na  Modes of Intervention:  na  Additional Comments:  na  Octavio Manns 04/05/2023, 10:36 AM

## 2023-04-05 NOTE — Group Note (Signed)
Recreation Therapy Group Note   Group Topic:Problem Solving  Group Date: 04/05/2023 Start Time: 1020 End Time: 1040 Facilitators: Roy Tokarz-McCall, LRT,CTRS Location: [address] [employer]   Group Topic: Team Building, Problem Solving  Goal Area(s) Addresses:  Patient will effectively work with peer towards shared goal.  Patient will identify skills used to make activity successful.  Patient will identify how skills used during activity can be applied to reach post d/c goals.   Intervention: STEM Activity- Glass blower/designer  Group Description: Tallest Pharmacist, community. In teams of 5-6, patients were given 11 craft pipe cleaners. Using the materials provided, patients were instructed to compete again the opposing team(s) to build the tallest free-standing structure from floor level. The activity was timed; difficulty increased by Clinical research associate as Production designer, theatre/television/film continued.  Systematically resources were removed with additional directions for example, placing one arm behind their back, working in silence, and shape stipulations. LRT facilitated post-activity discussion reviewing team processes and necessary communication skills involved in completion. Patients were encouraged to reflect how the skills utilized, or not utilized, in this activity can be incorporated to positively impact support systems post discharge.  Education: Pharmacist, community, Scientist, physiological, Discharge Planning   Education Outcome: Acknowledges education/In group clarification offered/Needs additional education.    Affect/Mood: N/A   Participation Level: Did not attend    Clinical Observations/Individualized Feedback:     Plan: Continue to engage patient in RT group sessions 2-3x/week.   Safwan Tomei-McCall, LRT,CTRS 04/05/2023 12:00 PM

## 2023-04-05 NOTE — Plan of Care (Signed)

## 2023-04-05 NOTE — Plan of Care (Signed)
Problem: Education: Goal: Emotional status will improve Outcome: Progressing Goal: Mental status will improve Outcome: Progressing   Problem: Coping: Goal: Ability to verbalize frustrations and anger appropriately will improve Outcome: Progressing

## 2023-04-05 NOTE — [facility] Group Notes (Signed)
Pt did not attend wrap-up group   

## 2023-04-05 NOTE — Progress Notes (Signed)
With pt permission, I contacted his mother: [name] Mother Emergency Contact [phone])   Did not answer - no VM reached.

## 2023-04-06 DIAGNOSIS — F250 Schizoaffective disorder, bipolar type: Secondary | ICD-10-CM | POA: Diagnosis not present
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
## 2023-04-06 NOTE — Progress Notes (Signed)
   04/06/23 2100  Psych Admission Type (Psych Patients Only)  Admission Status Involuntary  Psychosocial Assessment  Patient Complaints Depression  Eye Contact Fair  Facial Expression Flat  Affect Appropriate to circumstance  Speech Logical/coherent  Interaction Assertive  Motor Activity  (WNL)  Appearance/Hygiene Unremarkable  Behavior Characteristics Cooperative;Appropriate to situation  Mood Depressed  Thought Process  Coherency WDL  Content WDL  Delusions None reported or observed  Perception WDL  Hallucination None reported or observed  Judgment Poor  Confusion None  Danger to Self  Current suicidal ideation? Denies (Denies)  Self-Injurious Behavior No self-injurious ideation or behavior indicators observed or expressed  (No self injurious behaviors noted or reported)  Agreement Not to Harm Self Yes  Description of Agreement Verbal  Danger to Others  Danger to Others None reported or observed

## 2023-04-06 NOTE — Plan of Care (Deleted)
  Problem: Activity: Goal: Interest or engagement in activities will improve 04/06/2023 1910 by Shela Nevin, RN Outcome: Not Progressing 04/06/2023 1901 by Shela Nevin, RN Outcome: Not Progressing   Problem: Coping: Goal: Ability to verbalize frustrations and anger appropriately will improve 04/06/2023 1910 by Shela Nevin, RN Outcome: Progressing 04/06/2023 1901 by Shela Nevin, RN Outcome: Progressing Goal: Ability to demonstrate self-control will improve 04/06/2023 1910 by Shela Nevin, RN Outcome: Progressing 04/06/2023 1901 by Shela Nevin, RN Outcome: Progressing

## 2023-04-06 NOTE — [facility] Group Notes (Signed)
Child/Adolescent Psychoeducational Group Note  Date:  04/06/2023 Time:  8:46 PM  Group Topic/Focus:  Wrap-Up Group:   The focus of this group is to help patients review their daily goal of treatment and discuss progress on daily workbooks.  Participation Level:  did not attend  Participation Quality:  Inattentive  Affect:    Cognitive:    Insight:    Engagement in Group:    Modes of Intervention:    Additional Comments:   Pt did not attend group today [name] 04/06/2023, 8:46 PM

## 2023-04-06 NOTE — Plan of Care (Addendum)
Problem: Activity: Goal: Interest or engagement in activities will improve Outcome: Not Progressing   Problem: Coping: Goal: Ability to verbalize frustrations and anger appropriately will improve Outcome: Progressing Goal: Ability to demonstrate self-control will improve Outcome: Progressing

## 2023-04-06 NOTE — Progress Notes (Addendum)
D. Pt laying in bed upon initial approach this calm, cooperative, but guarded. Pt reported that the voices are still present but have improved. Pt currently denies SI/HI and VH.  A. Labs and vitals monitored. Pt given and educated on medications. Pt supported emotionally and encouraged to express concerns and ask questions.   R. Pt remains safe with 15 minute checks. Will continue POC.    04/06/23 1200  Psych Admission Type (Psych Patients Only)  Admission Status Involuntary  Psychosocial Assessment  Patient Complaints Depression  Eye Contact Fair  Facial Expression Flat  Affect Appropriate to circumstance  Speech Logical/coherent  Interaction Assertive  Motor Activity Other (Comment) (steady gait)  Appearance/Hygiene Unremarkable  Behavior Characteristics Cooperative;Appropriate to situation  Mood Depressed  Thought Process  Coherency WDL  Content WDL  Delusions None reported or observed  Perception Hallucinations  Hallucination Auditory  Judgment Poor  Confusion None  Danger to Self  Current suicidal ideation? Denies  Self-Injurious Behavior No self-injurious ideation or behavior indicators observed or expressed   Agreement Not to Harm Self Yes  Description of Agreement agreed to contact staff berore acting on harmful thoughts  Danger to Others  Danger to Others None reported or observed

## 2023-04-06 NOTE — Plan of Care (Signed)
Problem: Education: Goal: Mental status will improve Outcome: Progressing   Problem: Education: Goal: Emotional status will improve Outcome: Not Progressing   Problem: Activity: Goal: Interest or engagement in activities will improve Outcome: Not Progressing

## 2023-04-07 DIAGNOSIS — F250 Schizoaffective disorder, bipolar type: Secondary | ICD-10-CM | POA: Diagnosis not present

## 2023-04-07 MED ORDER — HALOPERIDOL DECANOATE 100 MG/ML IM SOLN
100.0000 mg | Freq: Once | INTRAMUSCULAR | Status: AC
  Administered 2023-04-07: 100 mg via INTRAMUSCULAR
  Filled 2023-04-07 (×2): qty 1

## 2023-04-07 NOTE — Plan of Care (Signed)
Problem: Coping: Goal: Ability to verbalize frustrations and anger appropriately will improve Outcome: Progressing   Problem: Coping: Goal: Ability to demonstrate self-control will improve Outcome: Progressing   Problem: Health Behavior/Discharge Planning: Goal: Compliance with treatment plan for underlying cause of condition will improve Outcome: Progressing   Problem: Safety: Goal: Periods of time without injury will increase Outcome: Progressing

## 2023-04-07 NOTE — Plan of Care (Signed)
  Problem: Education: Goal: Emotional status will improve Outcome: Progressing Goal: Mental status will improve Outcome: Progressing Goal: Verbalization of understanding the information provided will improve Outcome: Progressing   Problem: Health Behavior/Discharge Planning: Goal: Compliance with treatment plan for underlying cause of condition will improve Outcome: Progressing   Problem: Activity: Goal: Interest or engagement in activities will improve Outcome: Not Progressing

## 2023-04-07 NOTE — Progress Notes (Signed)
   04/07/23 0900  Psych Admission Type (Psych Patients Only)  Admission Status Involuntary  Psychosocial Assessment  Patient Complaints Depression  Eye Contact Fair  Facial Expression Flat  Affect Appropriate to circumstance  Speech Logical/coherent  Interaction Assertive  Motor Activity Other (Comment) (WNL)  Appearance/Hygiene Unremarkable  Behavior Characteristics Cooperative;Appropriate to situation  Mood Depressed  Thought Process  Coherency WDL  Content WDL  Delusions None reported or observed  Perception WDL  Hallucination None reported or observed  Judgment Poor  Confusion None  Danger to Self  Current suicidal ideation? Denies  Self-Injurious Behavior No self-injurious ideation or behavior indicators observed or expressed   Agreement Not to Harm Self Yes  Description of Agreement verbal  Danger to Others  Danger to Others None reported or observed

## 2023-04-07 NOTE — Group Note (Unsigned)
Date:  04/07/2023 Time:  8:29 PM  Group Topic/Focus:  Group Topic: @GROUPTOPIC @  Group Date: @GROUPDATE @ Start Time: @GROUPSTARTTIME @ End Time: @GROUPENDTIME @ Facilitators: @GROUPFACIL @  Department: @GROUPDEP @              Alfonse Ras     Participation Level:  {[facility] PARTICIPATION VHQIO:96295}  Participation Quality:  {[facility] PARTICIPATION QUALITY:22265}  Affect:  {[facility] AFFECT:22266}  Cognitive:  {[facility] COGNITIVE:22267}  Insight: {[facility] Insight2:20797}  Engagement in Group:  {[facility] ENGAGEMENT IN MWUXL:24401}  Modes of Intervention:  {[facility] MODES OF INTERVENTION:22269}  Additional Comments:  ***  Alfonse Ras 04/07/2023, 8:29 PM

## 2023-04-07 NOTE — Group Note (Signed)
Date:  04/07/2023 Time:  9:27 AM  Group Topic/Focus:  Goals Group:   The focus of this group is to help patients establish daily goals to achieve during treatment and discuss how the patient can incorporate goal setting into their daily lives to aide in recovery. Orientation:   The focus of this group is to educate the patient on the purpose and policies of crisis stabilization and provide a format to answer questions about their admission.  The group details unit policies and expectations of patients while admitted.    Participation Level:  Did Not Attend   Donell Beers 04/07/2023, 9:27 AM

## 2023-04-07 NOTE — Progress Notes (Signed)
   04/07/23 2100  Psych Admission Type (Psych Patients Only)  Admission Status Involuntary  Psychosocial Assessment  Patient Complaints Depression  Eye Contact Fair  Facial Expression Flat  Affect Appropriate to circumstance  Speech Logical/coherent  Interaction Assertive  Motor Activity  (WNL)  Appearance/Hygiene Unremarkable  Behavior Characteristics Cooperative  Mood Depressed  Thought Process  Coherency WDL  Content WDL  Delusions None reported or observed  Perception WDL  Hallucination None reported or observed  Judgment Poor  Confusion None  Danger to Self  Current suicidal ideation? Denies (Denies)  Self-Injurious Behavior No self-injurious ideation or behavior indicators observed or expressed  (No self injurious behaviors noted or reported)  Agreement Not to Harm Self Yes  Description of Agreement Verbal  Danger to Others  Danger to Others None reported or observed
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
## 2023-04-08 ENCOUNTER — Encounter (HOSPITAL_COMMUNITY): Payer: Self-pay

## 2023-04-08 DIAGNOSIS — F250 Schizoaffective disorder, bipolar type: Secondary | ICD-10-CM | POA: Diagnosis not present

## 2023-04-08 NOTE — BH IP Treatment Plan (Signed)
Interdisciplinary Treatment and Diagnostic Plan Update  04/08/2023 Time of Session: 12:05PM - UPDATE [name] MRN: [id number]  Principal Diagnosis: Schizoaffective disorder, bipolar type (HCC)  Secondary Diagnoses: Principal Problem:   Schizoaffective disorder, bipolar type (HCC)   Current Medications:  Current Facility-Administered Medications  Medication Dose Route Frequency Provider Last Rate Last Admin   acetaminophen (TYLENOL) tablet 650 mg  650 mg Oral Q6H PRN Augusto Gamble, MD       alum & mag hydroxide-simeth (MAALOX/MYLANTA) 200-200-20 MG/5ML suspension 30 mL  30 mL Oral Q4H PRN Augusto Gamble, MD       benztropine (COGENTIN) tablet 0.5 mg  0.5 mg Oral BID Massengill, Harrold Donath, MD   0.5 mg at 04/08/23 0845   bismuth subsalicylate (PEPTO BISMOL) chewable tablet 524 mg  524 mg Oral Q3H PRN Augusto Gamble, MD       haloperidol (HALDOL) tablet 5 mg  5 mg Oral TID PRN Phineas Inches, MD   5 mg at 04/03/23 1330   And   diphenhydrAMINE (BENADRYL) capsule 50 mg  50 mg Oral TID PRN Phineas Inches, MD   50 mg at 04/03/23 1330   haloperidol lactate (HALDOL) injection 5 mg  5 mg Intramuscular TID PRN Massengill, Harrold Donath, MD       And   diphenhydrAMINE (BENADRYL) injection 50 mg  50 mg Intramuscular TID PRN Massengill, Harrold Donath, MD       And   LORazepam (ATIVAN) injection 2 mg  2 mg Intramuscular TID PRN Massengill, Harrold Donath, MD       haloperidol lactate (HALDOL) injection 10 mg  10 mg Intramuscular TID PRN Massengill, Harrold Donath, MD       And   diphenhydrAMINE (BENADRYL) injection 50 mg  50 mg Intramuscular TID PRN Massengill, Harrold Donath, MD       And   LORazepam (ATIVAN) injection 2 mg  2 mg Intramuscular TID PRN Massengill, Harrold Donath, MD       haloperidol (HALDOL) tablet 10 mg  10 mg Oral Q12H Massengill, Harrold Donath, MD   10 mg at 04/08/23 0845   Or   haloperidol lactate (HALDOL) injection 5 mg  5 mg Intramuscular Q12H Massengill, Harrold Donath, MD       hydrOXYzine (ATARAX) tablet 50 mg  50 mg Oral  TID PRN Phineas Inches, MD   50 mg at 04/05/23 4098   nicotine (NICODERM CQ - dosed in mg/24 hours) patch 21 mg  21 mg Transdermal Daily Massengill, Harrold Donath, MD       nicotine polacrilex (NICORETTE) gum 2 mg  2 mg Oral PRN Massengill, Harrold Donath, MD       ondansetron (ZOFRAN) tablet 8 mg  8 mg Oral Q8H PRN Augusto Gamble, MD       polyethylene glycol (MIRALAX / GLYCOLAX) packet 17 g  17 g Oral Daily PRN Augusto Gamble, MD       senna (SENOKOT) tablet 8.6 mg  1 tablet Oral QHS PRN Augusto Gamble, MD       traZODone (DESYREL) tablet 50 mg  50 mg Oral QHS PRN Massengill, Harrold Donath, MD       PTA Medications: Medications Prior to Admission  Medication Sig Dispense Refill Last Dose   QUEtiapine (SEROQUEL) 100 MG tablet Take 100 mg by mouth at bedtime.       Patient Stressors: Health problems   Medication change or noncompliance   Other: auditory hallucinations    Patient Strengths: Capable of independent living  Manufacturing systems engineer  Motivation for treatment/growth  Supportive family/friends  Work skills  Treatment Modalities: Medication Management, Group therapy, Case management,  1 to 1 session with clinician, Psychoeducation, Recreational therapy.   Physician Treatment Plan for Primary Diagnosis: Schizoaffective disorder, bipolar type (HCC) Long Term Goal(s): Improvement in symptoms so as ready for discharge   Short Term Goals: Ability to identify changes in lifestyle to reduce recurrence of condition will improve Ability to verbalize feelings will improve Ability to disclose and discuss suicidal ideas Ability to demonstrate self-control will improve Ability to identify and develop effective coping behaviors will improve Ability to maintain clinical measurements within normal limits will improve Compliance with prescribed medications will improve Ability to identify triggers associated with substance abuse/mental health issues will improve  Medication Management: Evaluate patient's response,  side effects, and tolerance of medication regimen.  Therapeutic Interventions: 1 to 1 sessions, Unit Group sessions and Medication administration.  Evaluation of Outcomes: Progressing  Physician Treatment Plan for Secondary Diagnosis: Principal Problem:   Schizoaffective disorder, bipolar type (HCC)  Long Term Goal(s): Improvement in symptoms so as ready for discharge   Short Term Goals: Ability to identify changes in lifestyle to reduce recurrence of condition will improve Ability to verbalize feelings will improve Ability to disclose and discuss suicidal ideas Ability to demonstrate self-control will improve Ability to identify and develop effective coping behaviors will improve Ability to maintain clinical measurements within normal limits will improve Compliance with prescribed medications will improve Ability to identify triggers associated with substance abuse/mental health issues will improve     Medication Management: Evaluate patient's response, side effects, and tolerance of medication regimen.  Therapeutic Interventions: 1 to 1 sessions, Unit Group sessions and Medication administration.  Evaluation of Outcomes: Progressing   RN Treatment Plan for Primary Diagnosis: Schizoaffective disorder, bipolar type (HCC) Long Term Goal(s): Knowledge of disease and therapeutic regimen to maintain health will improve  Short Term Goals: Ability to remain free from injury will improve, Ability to verbalize frustration and anger appropriately will improve, Ability to participate in decision making will improve, Ability to verbalize feelings will improve, Ability to identify and develop effective coping behaviors will improve, and Compliance with prescribed medications will improve  Medication Management: RN will administer medications as ordered by provider, will assess and evaluate patient's response and provide education to patient for prescribed medication. RN will report any adverse  and/or side effects to prescribing provider.  Therapeutic Interventions: 1 on 1 counseling sessions, Psychoeducation, Medication administration, Evaluate responses to treatment, Monitor vital signs and CBGs as ordered, Perform/monitor CIWA, COWS, AIMS and Fall Risk screenings as ordered, Perform wound care treatments as ordered.  Evaluation of Outcomes: Progressing   LCSW Treatment Plan for Primary Diagnosis: Schizoaffective disorder, bipolar type (HCC) Long Term Goal(s): Safe transition to appropriate next level of care at discharge, Engage patient in therapeutic group addressing interpersonal concerns.  Short Term Goals: Engage patient in aftercare planning with referrals and resources, Increase social support, Increase emotional regulation, Facilitate acceptance of mental health diagnosis and concerns, Identify triggers associated with mental health/substance abuse issues, and Increase skills for wellness and recovery  Therapeutic Interventions: Assess for all discharge needs, 1 to 1 time with Social worker, Explore available resources and support systems, Assess for adequacy in community support network, Educate family and significant other(s) on suicide prevention, Complete Psychosocial Assessment, Interpersonal group therapy.  Evaluation of Outcomes: Progressing   Progress in Treatment: Attending groups: No. Participating in groups: No. Taking medication as prescribed: Yes. Toleration medication: Yes. Family/Significant other contact made: No, will contact:  mother, [name] [phone]  Patient understands diagnosis: No. Discussing patient identified problems/goals with staff: Yes. Medical problems stabilized or resolved: Yes. Denies suicidal/homicidal ideation: Yes. Issues/concerns per patient self-inventory: No.   New problem(s) identified: No, Describe:  none reported   New Short Term/Long Term Goal(s): medication stabilization, elimination of SI thoughts, development of  comprehensive mental wellness plan.      Patient Goals:  "Get discharged"   Discharge Plan or Barriers: Patient recently admitted. CSW will continue to follow and assess for appropriate referrals and possible discharge planning.      Reason for Continuation of Hospitalization: Hallucinations Medication stabilization   Estimated Length of Stay: 3-5 days  Last 3 [place] Suicide Severity Risk Score: Flowsheet Row [facility] (Current[facility] Most recent reading at 04/02/2023  2:00 PM ED from 04/02/2023 in [facility] Emergency Department at [facility] Most recent reading at 04/02/2023  1:15 PM ED from 04/02/2023 in [facility] Most recent reading at 04/02/2023  8:21 AM  C-SSRS RISK CATEGORY High Risk High Risk Moderate Risk       Last PHQ 2/9 Scores:    06/01/2014    2:19 PM  Depression screen PHQ 2/9  Decreased Interest 0  Down, Depressed, Hopeless 0  PHQ - 2 Score 0    Scribe for Treatment Team: Esmeralda Arthur 04/08/2023 2:28 PM

## 2023-04-08 NOTE — [facility] Group Notes (Signed)
Adult Psychoeducational Group Note  Date:  04/08/2023 Time:  9:52 AM  Group Topic/Focus:  Goals Group:   The focus of this group is to help patients establish daily goals to achieve during treatment and discuss how the patient can incorporate goal setting into their daily lives to aide in recovery.  Participation Level:  na  Participation Quality:  na  Affect:  na  Cognitive:  na  Insight: na  Engagement in Group:  na  Modes of Intervention:  na  Additional Comments:  na  Octavio Manns 04/08/2023, 9:52 AM
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
## 2023-04-08 NOTE — B[facility] Group Notes (Signed)
Adult Psychoeducational Group Note  Date:  04/08/2023 Time:  8:58 PM  Group Topic/Focus:  Wrap-Up Group:   The focus of this group is to help patients review their daily goal of treatment and discuss progress on daily workbooks.  Participation Level:  Did Not Attend  Christ Kick 04/08/2023, 8:58 PM

## 2023-04-08 NOTE — Group Note (Signed)
Recreation Therapy Group Note   Group Topic:Coping Skills  Group Date: 04/08/2023 Start Time: 1013 End Time: 1045 Facilitators: Mossie Gilder-McCall, LRT,CTRS Location: [address]   Group Topic: Coping Skills  Goal Area(s) Addresses:  Patient will identify the importance of exercises. Patient will identify the things they use as coping skills.  Group Description:  LRT and patients discussed the importance of coping skills and how they could be used to help people when dealing with uneasy situations. Patients would then identify what they personally use as coping skills.  Education: Pharmacologist, Building control surveyor.   Education Outcome: Acknowledges understanding/In group clarification offered/Needs additional education.    Affect/Mood: N/A   Participation Level: Did not attend    Clinical Observations/Individualized Feedback:      Plan: Continue to engage patient in RT group sessions 2-3x/week.   [name], LRT,CTRS  04/08/2023 12:46 PM

## 2023-04-08 NOTE — Progress Notes (Signed)
   04/08/23 2100  Psych Admission Type (Psych Patients Only)  Admission Status Involuntary  Psychosocial Assessment  Patient Complaints Depression  Eye Contact Fair  Facial Expression Flat  Affect Appropriate to circumstance  Speech Logical/coherent  Interaction Assertive  Motor Activity  (WNL)  Appearance/Hygiene Unremarkable  Behavior Characteristics Cooperative  Mood Depressed  Thought Process  Coherency WDL  Content WDL  Delusions None reported or observed  Perception WDL  Hallucination None reported or observed  Judgment Poor  Confusion None  Danger to Self  Current suicidal ideation? Denies (Denies)  Self-Injurious Behavior No self-injurious ideation or behavior indicators observed or expressed  (No self injurious behaviors noted or reported)  Agreement Not to Harm Self Yes  Description of Agreement Verbal  Danger to Others  Danger to Others None reported or observed

## 2023-04-08 NOTE — Progress Notes (Signed)
Assumed care of patient this pm, he presented A&O x4 , affect brighter than previous reported mood remain depressed. Pt seclusive and isolative to his room.  behavior appropriate to situation and   c/o constipation reported "only a little comes out, very hard and my stomach hurts". Pt given Mira lax and Senokot with results pending. Pt denied thoughts, plan or intent to harm self or others and made a safety commitment, Pt also denied having AVH and none noted. Pt is compliant with taking medications and plan of care  Pt educated on plan of care, medication regimen and he acknowledged an understanding and was without further complaints or concerns. Patient is been maintained on q 15 minute rounds.

## 2023-04-08 NOTE — Plan of Care (Signed)
  Problem: Education: Goal: Knowledge of [place] Education information/materials will improve Outcome: Progressing Goal: Emotional status will improve Outcome: Progressing Goal: Mental status will improve Outcome: Progressing Goal: Verbalization of understanding the information provided will improve Outcome: Progressing   Problem: Activity: Goal: Interest or engagement in activities will improve Outcome: Progressing Goal: Sleeping patterns will improve Outcome: Progressing   Problem: Coping: Goal: Ability to verbalize frustrations and anger appropriately will improve Outcome: Progressing Goal: Ability to demonstrate self-control will improve Outcome: Progressing   Problem: Physical Regulation: Goal: Ability to maintain clinical measurements within normal limits will improve Outcome: Progressing   Problem: Safety: Goal: Periods of time without injury will increase Outcome: Progressing

## 2023-04-08 NOTE — Progress Notes (Signed)
Assumed care of patient this am, he present A&O x 4, affect flat & mood, sad and depressed. Pt isolative to his room, behavior appropriate to situation and was without complaints. Pt denied thoughts, plan or intent to harm self or others and made a safety commitment, he also denied having A/V/H today. Pt is compliant with taking medications and is pleasant and cooperative, "  I'm doing well, sleeping good"  and evasive around other treatment issues. Pt educated on plan of care, medication regimen and he acknowledged an understanding and was without any complaints or concerns. Patient is been maintained on q 15 rounds for safety and support.

## 2023-04-08 NOTE — Group Note (Unsigned)
Date:  04/08/2023 Time:  9:55 AM  Group Topic/Focus:  Goals Group:   The focus of this group is to help patients establish daily goals to achieve during treatment and discuss how the patient can incorporate goal setting into their daily lives to aide in recovery.     Participation Level:  {B[facility] PARTICIPATION BJYNW:29562}  Participation Quality:  {B[facility] PARTICIPATION QUALITY:22265}  Affect:  {BHH AFFECT:22266}  Cognitive:  {B[facility] COGNITIVE:22267}  Insight: {BHH Insight2:20797}  Engagement in Group:  {BHH ENGAGEMENT IN ZHYQM:57846}  Modes of Intervention:  {BHH MODES OF INTERVENTION:22269}  Additional Comments:  ***  Octavio Manns 04/08/2023, 9:55 AM

## 2023-04-08 NOTE — Plan of Care (Signed)
  Problem: Education: Goal: Emotional status will improve Outcome: Progressing Goal: Mental status will improve Outcome: Progressing   Problem: Activity: Goal: Interest or engagement in activities will improve Outcome: Not Progressing   

## 2023-04-09 DIAGNOSIS — F250 Schizoaffective disorder, bipolar type: Secondary | ICD-10-CM | POA: Diagnosis not present

## 2023-04-09 MED ORDER — HALOPERIDOL DECANOATE 100 MG/ML IM SOLN: 200.0000 mg | Freq: Once | INTRAMUSCULAR | Status: DC

## 2023-04-09 MED ORDER — HALOPERIDOL DECANOATE 100 MG/ML IM SOLN
100.0000 mg | Freq: Once | INTRAMUSCULAR | Status: AC
  Administered 2023-04-10: 100 mg via INTRAMUSCULAR
  Filled 2023-04-09: qty 1

## 2023-04-09 NOTE — Progress Notes (Signed)
   04/09/23 2300  Psych Admission Type (Psych Patients Only)  Admission Status Involuntary  Psychosocial Assessment  Patient Complaints Depression  Eye Contact Fair  Facial Expression Flat  Affect Appropriate to circumstance  Speech Logical/coherent  Interaction Assertive  Motor Activity Other (Comment) (WNL)  Appearance/Hygiene Unremarkable  Behavior Characteristics Appropriate to situation  Mood Depressed  Thought Process  Coherency WDL  Content WDL  Delusions None reported or observed  Perception WDL  Hallucination None reported or observed  Judgment Poor  Confusion None  Danger to Self  Current suicidal ideation? Denies (Denies)  Self-Injurious Behavior No self-injurious ideation or behavior indicators observed or expressed  (No self injurious behaviors noted or reported)  Agreement Not to Harm Self Yes  Description of Agreement Verbal  Danger to Others  Danger to Others None reported or observed

## 2023-04-09 NOTE — Progress Notes (Signed)
CSW spoke with Probation Officer [place] [phone] to inform of pt's disposition. Officer requested a copy of pt's discharge instructions following discharge to assist with follow up care. CSW will continue to follow and provide updates.
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
## 2023-04-09 NOTE — [facility] Group Notes (Signed)
Adult Psychoeducational Group Note  Date:  04/09/2023 Time:  10:25 AM  Group Topic/Focus:  Goals Group:   The focus of this group is to help patients establish daily goals to achieve during treatment and discuss how the patient can incorporate goal setting into their daily lives to aide in recovery.  Participation Level:  na  Participation Quality:  na  Affect:  na  Cognitive:  na  Insight: na  Engagement in Group:  na  Modes of Intervention:  na  Additional Comments:  na  Octavio Manns 04/09/2023, 10:25 AM

## 2023-04-09 NOTE — Plan of Care (Signed)
Problem: Education: Goal: Knowledge of [facility] Education information/materials will improve Outcome: Progressing Goal: Emotional status will improve Outcome: Progressing   Problem: Coping: Goal: Ability to verbalize frustrations and anger appropriately will improve Outcome: Progressing
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
## 2023-04-09 NOTE — Group Note (Signed)
LCSW Group Therapy Note   Group Date: 04/09/2023 Start Time: 1400 End Time: 1500  Type of Therapy and Topic:  Group Therapy:  Communication Barriers  Participation Level:  DID NOT ATTEND  Description of Group:   Therapeutic Goals:   1)  Identify two factors that make it difficult for others to communicate with you and why  2)  Identify the results of such communication barriers.  3)  Identify two changes you are willing to make to overcome communication barriers leading to increased communication  4)  Describe how these changes will make you a better communicator and improve mental health     [name] 04/09/2023  2:42 PM

## 2023-04-09 NOTE — [facility] Group Notes (Signed)
Adult Psychoeducational Group Note  Date:  04/09/2023 Time:  9:32 PM  Group Topic/Focus:  Wrap-Up Group:   The focus of this group is to help patients review their daily goal of treatment and discuss progress on daily workbooks.  Participation Level:  Did Not Attend  Christ Kick 04/09/2023, 9:32 PM

## 2023-04-09 NOTE — Progress Notes (Signed)
   04/09/23 1008  Psych Admission Type (Psych Patients Only)  Admission Status Involuntary  Psychosocial Assessment  Patient Complaints Depression  Eye Contact Fair  Facial Expression Flat  Affect Appropriate to circumstance  Speech Logical/coherent  Interaction Minimal  Motor Activity Other (Comment) (WDL)  Appearance/Hygiene Unremarkable  Behavior Characteristics Appropriate to situation  Mood Depressed  Thought Process  Coherency WDL  Content WDL  Delusions None reported or observed  Perception WDL  Hallucination None reported or observed  Judgment Poor  Confusion None  Danger to Self  Current suicidal ideation? Denies  Self-Injurious Behavior No self-injurious ideation or behavior indicators observed or expressed   Agreement Not to Harm Self Yes  Description of Agreement Verbal  Danger to Others  Danger to Others None reported or observed

## 2023-04-09 NOTE — Plan of Care (Signed)
°  Problem: Education: °Goal: Emotional status will improve °Outcome: Progressing °Goal: Mental status will improve °Outcome: Progressing °Goal: Verbalization of understanding the information provided will improve °Outcome: Progressing °  °

## 2023-04-10 DIAGNOSIS — F250 Schizoaffective disorder, bipolar type: Secondary | ICD-10-CM | POA: Diagnosis not present

## 2023-04-10 MED ORDER — HALOPERIDOL 10 MG PO TABS: 10.0000 mg | ORAL_TABLET | Freq: Two times a day (BID) | ORAL | 0 refills | Status: DC

## 2023-04-10 MED ORDER — NICOTINE POLACRILEX 2 MG MT GUM: 2.0000 mg | CHEWING_GUM | OROMUCOSAL | 0 refills | Status: DC | PRN

## 2023-04-10 MED ORDER — HYDROXYZINE HCL 50 MG PO TABS: 50.0000 mg | ORAL_TABLET | Freq: Three times a day (TID) | ORAL | 0 refills | Status: DC | PRN

## 2023-04-10 MED ORDER — BENZTROPINE MESYLATE 0.5 MG PO TABS: 0.5000 mg | ORAL_TABLET | Freq: Two times a day (BID) | ORAL | 0 refills | Status: DC

## 2023-04-10 MED ORDER — NICOTINE 21 MG/24HR TD PT24: 21.0000 mg | MEDICATED_PATCH | Freq: Every day | TRANSDERMAL | 0 refills | Status: DC

## 2023-04-10 MED ORDER — HALOPERIDOL DECANOATE 100 MG/ML IM SOLN: 200.0000 mg | Freq: Once | INTRAMUSCULAR | 0 refills | Status: DC

## 2023-04-10 NOTE — [facility] Suicide Risk Assessment (Signed)
[facility] Discharge Suicide Risk Assessment   Principal Problem: Schizoaffective disorder, bipolar type [facility]) Discharge Diagnoses: Principal Problem:   Schizoaffective disorder, bipolar type (HCC)   Total Time spent with patient: 45 minutes  Reason for admission: Patient is a 42 year old male with a reported psychiatric history of schizoaffective disorder bipolar type who was admitted to the psychiatric hospital under IVC for psychosis and suicidal thoughts.   Patient reports not taking psychiatric medication for some time prior to this admission.  PTA Medications:  Patient reports not taking psychiatric medication for some time prior to this admission.   Hospital Course:   During the patient's hospitalization, patient had extensive initial psychiatric evaluation, and follow-up psychiatric evaluations every day.  Psychiatric diagnoses provided upon initial assessment: Schizoaffective disorder bipolar type   Patient's psychiatric medications were adjusted on admission: -Stop Seroquel that was started by admitting provider.  Patient needs a stronger and psychotic disorder severity of psychosis and also mood stabilization -Start Zyprexa 10 mg twice daily for psychosis and mood stabilization.  The patient refused this medication, or request will be made to a second psychiatrist for evaluation for forced medication over objection for an IVC patient.  During the hospitalization, other adjustments were made to the patient's psychiatric medication regimen: Zyprexa was discontinued during hospital stay secondary to lack of response, patient was started on Haldol p.o. titrated up to 10 mg twice daily for psychosis, was effective and well-tolerated then patient was started on Haldol LAI was given 100 mg IM followed by another 100 mg IM on day 4/last day of hospital stay  Patient's care was discussed during the interdisciplinary team meeting every day during the hospitalization.  The patient denied  having side effects to prescribed psychiatric medication.  Gradually, patient started adjusting to milieu. The patient was evaluated each day by a clinical provider to ascertain response to treatment. Improvement was noted by the patient's report of decreasing symptoms, improved sleep and appetite, affect, medication tolerance, behavior, and participation in unit programming.  Patient was asked each day to complete a self inventory noting mood, mental status, pain, new symptoms, anxiety and concerns.    Symptoms were reported as significantly decreased or resolved completely by discharge.   On day of discharge, patient was evaluated on 04/10/2023 the patient reports that their mood is stable. The patient denied having suicidal thoughts for more than 48 hours prior to discharge.  Patient denies having homicidal thoughts.  Patient denies having auditory hallucinations.  Patient denies any visual hallucinations or other symptoms of psychosis. The patient was motivated to continue taking medication with a goal of continued improvement in mental health.  On day of discharge patient presented linear and organized yet concrete reporting related hallucinations at baseline without any recent worsening under good control with current medication regimen, he does agree to comply with recommendation for outpatient follow-up appointment and to comply with medications after discharge including p.o. Haldol for the next month then Haldol LAI monthly. The patient reports their target psychiatric symptoms of worsening psychosis AH and paranoia responded well to the psychiatric medications, and the patient reports overall benefit other psychiatric hospitalization. Supportive psychotherapy was provided to the patient. The patient also participated in regular group therapy while hospitalized. Coping skills, problem solving as well as relaxation therapies were also part of the unit programming.  Labs were reviewed with the  patient, and abnormal results were discussed with the patient.  The patient is able to verbalize their individual safety plan to this provider.  Behavioral  Events: None  Restraints: None  Groups: Limited attendance secondary to guardedness which is his baseline  Medications Changes: As above  Sleep  Fair, improved during hospital stay  Musculoskeletal: Strength & Muscle Tone: within normal limits Gait & Station: normal Patient leans: N/A  Psychiatric Specialty Exam  General Appearance: appears at stated age, fairly dressed and groomed  Behavior: pleasant and cooperative  Psychomotor Activity:No psychomotor agitation or retardation noted   Eye Contact: good Speech: normal amount, tone, volume and latency   Mood: euthymic Affect: congruent, pleasant and interactive  Thought Process: linear, goal directed, no circumstantial or tangential thought process noted, no racing thoughts or flight of ideas Descriptions of Associations: intact Thought Content: Hallucinations: denies AH, VH , does not appear responding to stimuli Delusions: No paranoia or other delusions noted Suicidal Thoughts: denies SI, intention, plan  Homicidal Thoughts: denies HI, intention, plan   Alertness/Orientation: alert and fully oriented  Insight: fair, improved Judgment: fair, improved  Memory: intact  Executive Functions  Concentration: intact  Attention Span: Fair Recall: intact Fund of Knowledge: fair   Art therapist  Concentration: intact Attention Span: Fair Recall: intact Fund of Knowledge: fair   Assets  Assets: Resilience   Physical Exam: Physical Exam ROS Blood pressure 115/84, pulse 83, temperature 98.3 F (36.8 C), temperature source Oral, resp. rate 18, height 5\' 11"  (1.803 m), weight 90.7 kg, SpO2 100%. Body mass index is 27.89 kg/m.  Mental Status Per Nursing Assessment::   On Admission:  Suicidal ideation indicated by patient  Demographic Factors:   Low socioeconomic status and Unemployed  Loss Factors: Legal issues  Historical Factors: Impulsivity  Risk Reduction Factors:   Living with another person, especially a relative and Positive social support  Continued Clinical Symptoms:  Bipolar Disorder:   Mixed State Schizophrenia:   Paranoid or undifferentiated type  Cognitive Features That Contribute To Risk:  Closed-mindedness    Suicide Risk:  Minimal: No identifiable suicidal ideation.  Patients presenting with no risk factors but with morbid ruminations; may be classified as minimal risk based on the severity of the depressive symptoms   Follow-up Information     [facility]. Schedule an appointment as soon as possible for a visit.   Why: Per provider request, please call to personally schedule appointments for therapy and medication management services. Contact information: [address], [place], [place] [address]  [email or web address] P: [phone] Fax [phone]        [facility] Go on 04/15/2023.   Why: You have a hospital follow up appointment for therapy and medication management services on 04/15/23 at 8:00 am with [facility] .  You will need to receive your next long acting injectible medication on or around 05/10/23. Contact information: [address] [place] [address] [phone]                 Plan Of Care/Follow-up recommendations:    Discharge recommendations:     Activity: as tolerated  Diet: heart healthy  # It is recommended to the patient to continue psychiatric medications as prescribed, after discharge from the hospital.     # It is recommended to the patient to follow up with your outpatient psychiatric provider and PCP.   # It was discussed with the patient, the impact of alcohol, drugs, tobacco have been there overall psychiatric and medical wellbeing, and total abstinence from substance use was recommended the  patient.ed.   # Prescriptions provided or  sent directly to preferred pharmacy at discharge. Patient agreeable to plan. Given opportunity to ask questions. Appears to feel comfortable with discharge.    # In the event of worsening symptoms, the patient is instructed to call the crisis hotline, 911 and or go to the nearest ED for appropriate evaluation and treatment of symptoms. To follow-up with primary care provider for other medical issues, concerns and or health care needs   # Patient was discharged home with a plan to follow up as noted above.   Patient agrees with D/C instructions and plan.  The patient received suicide prevention pamphlet:  Yes Belongings returned:  Clothing and Valuables  Total Time Spent in Direct Patient Care:  I personally spent 45 minutes on the unit in direct patient care. The direct patient care time included face-to-face time with the patient, reviewing the patient's chart, communicating with other professionals, and coordinating care. Greater than 50% of this time was spent in counseling or coordinating care with the patient regarding goals of hospitalization, psycho-education, and discharge planning needs.   Kurtiss Wence 04/10/2023, 9:40 AM
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
## 2023-04-10 NOTE — Progress Notes (Signed)
Patient discharged home. Denies SI/HI/AVH at d/c/ No distress noted.

## 2023-04-10 NOTE — Plan of Care (Signed)

## 2023-04-10 NOTE — Progress Notes (Signed)
  [facility] Adult Case Management Discharge Plan :  Will you be returning to the same living situation after discharge:  Yes,  pt will be returning home with mother [name] [phone] At discharge, do you have transportation home?: Yes,  pt will be transported by  mother Do you have the ability to pay for your medications: Yes,  pt has active medical cover  Release of information consent forms completed and in the chart;  Patient's signature needed at discharge.  Patient to Follow up at:  Follow-up Information     [facility]. Schedule an appointment as soon as possible for a visit.   Why: Per provider request, please call to personally schedule appointments for therapy and medication management services. Contact information: [address], [place], [place] [address]  [email or web address] P: [phone] Fax [phone]        [facility] Go on 04/15/2023.   Why: You have a hospital follow up appointment for therapy and medication management services on 04/15/23 at 8:00 am with [facility] .  You will need to receive your next long acting injectible medication on or around 05/10/23. Contact information: [address] [place] [address] [phone]         [facility] Follow up.   Why: If you are interested in smoking cessation, please call number listed. Contact information: Get free tobacco cessation help 24/7 in several ways:  1-800-QUIT-NOW (970) 858-7028);                Next level of care provider has access to [facility] Link:no  Safety Planning and Suicide Prevention discussed: Yes,  SPE completed with mother, [name] [phone]     Has patient been referred to the Quitline?: Yes, faxed/e-referral on - pt was given contact information for the Quit Line  Patient has been referred for addiction treatment: Patient refused referral for treatment.  Ryer Asato R, LCSWA 04/10/2023, 10:14 AM

## 2023-05-11 ENCOUNTER — Ambulatory Visit (HOSPITAL_COMMUNITY)
Admission: EM | Admit: 2023-05-11 | Discharge: 2023-05-11 | Disposition: A | Discharge status: Home / Self Care | Payer: No Typology Code available for payment source | Attending: Emergency Medicine | Admitting: Emergency Medicine

## 2023-05-11 ENCOUNTER — Encounter (HOSPITAL_COMMUNITY): Payer: Self-pay | Admitting: Emergency Medicine

## 2023-05-11 ENCOUNTER — Other Ambulatory Visit: Payer: Self-pay

## 2023-05-11 DIAGNOSIS — Z113 Encounter for screening for infections with a predominantly sexual mode of transmission: Secondary | ICD-10-CM | POA: Diagnosis not present

## 2023-05-11 DIAGNOSIS — N3001 Acute cystitis with hematuria: Secondary | ICD-10-CM | POA: Diagnosis present

## 2023-05-11 LAB — POCT URINALYSIS DIP (MANUAL ENTRY)
Bilirubin, UA: NEGATIVE
Glucose, UA: NEGATIVE mg/dL
Ketones, POC UA: NEGATIVE mg/dL
Nitrite, UA: NEGATIVE
Protein Ur, POC: NEGATIVE mg/dL
Spec Grav, UA: 1.025 (ref 1.010–1.025)
Urobilinogen, UA: 0.2 U/dL
pH, UA: 6.5 (ref 5.0–8.0)

## 2023-05-11 MED ORDER — NITROFURANTOIN MONOHYD MACRO 100 MG PO CAPS: 100.0000 mg | ORAL_CAPSULE | Freq: Two times a day (BID) | ORAL | 0 refills | Status: DC
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
## 2023-05-11 NOTE — Discharge Instructions (Addendum)
Urinalysis done today which does show some blood and Andrian Urbach blood cells which could be consistent with a urinary tract infection.  Given the abdominal pain and groin pain we will treat with the following: Macrobid 100 mg twice daily for 5 days Drink plenty of water and stay hydrated Avoid a lot of caffeinated beverages Return to urgent care or PCP if symptoms worsen or fail to resolve.    Screening swab and blood work done today and results will be available in 24-48 hours. We will contact you if we need to arrange additional treatment based on your testing. Negative results will be on your MyChart account. Abstain from sex until you receive your final results.  Use a condom for sexual encounters. If you have any worsening or changing symptoms including abnormal discharge, pelvic pain, abdominal pain, fever, nausea, or vomiting, then you should be reevaluated.

## 2023-05-11 NOTE — ED Triage Notes (Signed)
Patient is asking to be checked for std.  Patient has not noticed any discharge

## 2023-05-12 LAB — RPR: RPR Ser Ql: NONREACTIVE

## 2023-05-13 LAB — CYTOLOGY, (ORAL, ANAL, URETHRAL) ANCILLARY ONLY
Chlamydia: NEGATIVE
Comment: NEGATIVE
Comment: NEGATIVE
Comment: NORMAL
Neisseria Gonorrhea: NEGATIVE
Trichomonas: NEGATIVE

## 2023-05-13 LAB — HIV ANTIBODY (ROUTINE TESTING W REFLEX): HIV Screen 4th Generation wRfx: NONREACTIVE

## 2023-10-28 ENCOUNTER — Other Ambulatory Visit: Payer: Self-pay

## 2023-10-28 ENCOUNTER — Encounter (HOSPITAL_COMMUNITY): Payer: Self-pay | Admitting: *Deleted

## 2023-10-28 ENCOUNTER — Ambulatory Visit (HOSPITAL_COMMUNITY): Admission: EM | Admit: 2023-10-28 | Discharge: 2023-10-28 | Disposition: A | Discharge status: Home / Self Care

## 2023-10-28 DIAGNOSIS — T7840XA Allergy, unspecified, initial encounter: Secondary | ICD-10-CM | POA: Diagnosis not present

## 2023-10-28 DIAGNOSIS — T63481A Toxic effect of venom of other arthropod, accidental (unintentional), initial encounter: Secondary | ICD-10-CM

## 2023-10-28 DIAGNOSIS — M25432 Effusion, left wrist: Secondary | ICD-10-CM

## 2023-10-28 MED ORDER — PREDNISONE 20 MG PO TABS: 40.0000 mg | ORAL_TABLET | Freq: Every day | ORAL | 0 refills | Status: AC

## 2023-10-28 MED ORDER — CETIRIZINE HCL 10 MG PO TABS: 10.0000 mg | ORAL_TABLET | Freq: Every day | ORAL | 0 refills | Status: DC

## 2023-10-28 MED ORDER — PREDNISONE 20 MG PO TABS: 40.0000 mg | ORAL_TABLET | Freq: Every day | ORAL | 0 refills | Status: DC

## 2023-10-28 MED ORDER — FAMOTIDINE 20 MG PO TABS: 20.0000 mg | ORAL_TABLET | Freq: Every day | ORAL | 0 refills | Status: DC

## 2023-10-28 NOTE — ED Triage Notes (Addendum)
 Pt states he noticed a knot to left wrist last night; this AM c/o swelling to site with pain radiating up into left forearm, and some numbness in left thumb. Denies fevers. States no known insect bite that he's aware of. Has not taken any measures to help alleviate pain at this time.

## 2023-10-28 NOTE — Discharge Instructions (Signed)
You have been evaluated today for an allergic reaction. We gave you medicine to help with symptoms.   Take medications sent to pharmacy as directed.  You may take an over the counter antihistamine (Claritin or Zyrtec) for the next 5-7 days as well as a medication called famotidine (Pepcid) over the counter 20mg  daily for 5-7 days to further reduce your symptoms.   Please schedule an appointment with your primary care provider for follow-up and ongoing management. Return if you experience rashes, difficulty breathing or swallowing, lip/mouth/tongue swelling, vomiting, or for any other concerning symptoms. If symptoms are severe, please go to the ER for further workup.
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
## 2023-11-20 ENCOUNTER — Encounter (HOSPITAL_COMMUNITY): Payer: Self-pay

## 2023-11-20 ENCOUNTER — Ambulatory Visit (HOSPITAL_COMMUNITY)
Admission: EM | Admit: 2023-11-20 | Discharge: 2023-11-20 | Disposition: A | Discharge status: Home / Self Care | Attending: Family Medicine | Admitting: Family Medicine

## 2023-11-20 DIAGNOSIS — K644 Residual hemorrhoidal skin tags: Secondary | ICD-10-CM

## 2023-11-20 NOTE — Discharge Instructions (Signed)
 Use over the counter preparation H with lidocaine . This will help with your rectal pain.

## 2023-11-20 NOTE — ED Triage Notes (Signed)
 Patient states recurrent hemorrhoids. He has been using Prepation H.
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
## 2024-02-11 ENCOUNTER — Other Ambulatory Visit: Payer: Self-pay

## 2024-02-11 ENCOUNTER — Emergency Department (HOSPITAL_COMMUNITY)

## 2024-02-11 ENCOUNTER — Emergency Department (HOSPITAL_COMMUNITY)
Admission: EM | Admit: 2024-02-11 | Discharge: 2024-02-12 | Disposition: A | Discharge status: Other Acute Inpatient Hospital | Attending: Emergency Medicine | Admitting: Emergency Medicine

## 2024-02-11 DIAGNOSIS — R456 Violent behavior: Secondary | ICD-10-CM | POA: Insufficient documentation

## 2024-02-11 DIAGNOSIS — R451 Restlessness and agitation: Secondary | ICD-10-CM | POA: Insufficient documentation

## 2024-02-11 DIAGNOSIS — X838XXA Intentional self-harm by other specified means, initial encounter: Secondary | ICD-10-CM | POA: Diagnosis not present

## 2024-02-11 DIAGNOSIS — S0101XA Laceration without foreign body of scalp, initial encounter: Secondary | ICD-10-CM | POA: Insufficient documentation

## 2024-02-11 DIAGNOSIS — R45850 Homicidal ideations: Secondary | ICD-10-CM | POA: Diagnosis not present

## 2024-02-11 DIAGNOSIS — R443 Hallucinations, unspecified: Secondary | ICD-10-CM | POA: Diagnosis not present

## 2024-02-11 DIAGNOSIS — R4689 Other symptoms and signs involving appearance and behavior: Secondary | ICD-10-CM | POA: Diagnosis not present

## 2024-02-11 DIAGNOSIS — R45851 Suicidal ideations: Secondary | ICD-10-CM | POA: Diagnosis not present

## 2024-02-11 DIAGNOSIS — J45909 Unspecified asthma, uncomplicated: Secondary | ICD-10-CM | POA: Diagnosis not present

## 2024-02-11 DIAGNOSIS — S0990XA Unspecified injury of head, initial encounter: Secondary | ICD-10-CM | POA: Diagnosis present

## 2024-02-11 LAB — COMPREHENSIVE METABOLIC PANEL WITH GFR
ALT: 22 U/L (ref 0–44)
AST: 37 U/L (ref 15–41)
Albumin: 4.2 g/dL (ref 3.5–5.0)
Alkaline Phosphatase: 58 U/L (ref 38–126)
Anion gap: 16 — ABNORMAL HIGH (ref 5–15)
BUN: 12 mg/dL (ref 6–20)
CO2: 18 mmol/L — ABNORMAL LOW (ref 22–32)
Calcium: 8.7 mg/dL — ABNORMAL LOW (ref 8.9–10.3)
Chloride: 102 mmol/L (ref 98–111)
Creatinine, Ser: 1.23 mg/dL (ref 0.61–1.24)
GFR, Estimated: >60 mL/min (ref >60)
Glucose, Bld: 205 mg/dL — ABNORMAL HIGH (ref 70–99)
Potassium: 3.3 mmol/L — ABNORMAL LOW (ref 3.5–5.1)
Sodium: 136 mmol/L (ref 135–145)
Total Bilirubin: 1.2 mg/dL (ref 0.0–1.2)
Total Protein: 6.6 g/dL (ref 6.5–8.1)

## 2024-02-11 LAB — CBC WITH DIFFERENTIAL/PLATELET
Abs Immature Granulocytes: 0.01 K/uL (ref 0.00–0.07)
Basophils Absolute: 0 K/uL (ref 0.0–0.1)
Basophils Relative: 1 %
Eosinophils Absolute: 0.1 K/uL (ref 0.0–0.5)
Eosinophils Relative: 1 %
HCT: 45.9 % (ref 39.0–52.0)
Hemoglobin: 16.2 g/dL (ref 13.0–17.0)
Immature Granulocytes: 0 %
Lymphocytes Relative: 28 %
Lymphs Abs: 1.5 K/uL (ref 0.7–4.0)
MCH: 32.6 pg (ref 26.0–34.0)
MCHC: 35.3 g/dL (ref 30.0–36.0)
MCV: 92.4 fL (ref 80.0–100.0)
Monocytes Absolute: 0.5 K/uL (ref 0.1–1.0)
Monocytes Relative: 8 %
Neutro Abs: 3.5 K/uL (ref 1.7–7.7)
Neutrophils Relative %: 62 %
Platelets: 271 K/uL (ref 150–400)
RBC: 4.97 MIL/uL (ref 4.22–5.81)
RDW: 12.2 % (ref 11.5–15.5)
WBC: 5.6 K/uL (ref 4.0–10.5)
nRBC: 0 % (ref 0.0–0.2)

## 2024-02-11 LAB — RAPID URINE DRUG SCREEN, HOSP PERFORMED
Amphetamines: NOT DETECTED
Barbiturates: NOT DETECTED
Benzodiazepines: NOT DETECTED
Cocaine: NOT DETECTED
Opiates: NOT DETECTED
Tetrahydrocannabinol: NOT DETECTED

## 2024-02-11 LAB — ACETAMINOPHEN LEVEL: Acetaminophen (Tylenol), Serum: <10 ug/mL — ABNORMAL LOW (ref 10–30)

## 2024-02-11 LAB — ETHANOL: Alcohol, Ethyl (B): <15 mg/dL (ref <15)

## 2024-02-11 LAB — SALICYLATE LEVEL: Salicylate Lvl: <7 mg/dL — ABNORMAL LOW (ref 7.0–30.0)

## 2024-02-11 MED ORDER — ZIPRASIDONE MESYLATE 20 MG IM SOLR
10.0000 mg | Freq: Four times a day (QID) | INTRAMUSCULAR | Status: DC | PRN
  Administered 2024-02-11: 10 mg via INTRAMUSCULAR
  Filled 2024-02-11: qty 20

## 2024-02-11 MED ORDER — LORAZEPAM 2 MG/ML IJ SOLN
2.0000 mg | Freq: Once | INTRAMUSCULAR | Status: AC
Start: 2024-02-11 — End: 2024-02-11
  Administered 2024-02-11: 2 mg via INTRAMUSCULAR
  Filled 2024-02-11: qty 1

## 2024-02-11 MED ORDER — ZIPRASIDONE MESYLATE 20 MG IM SOLR
20.0000 mg | Freq: Once | INTRAMUSCULAR | Status: AC
  Administered 2024-02-11: 20 mg via INTRAMUSCULAR
  Filled 2024-02-11: qty 20

## 2024-02-11 MED ORDER — LORAZEPAM 2 MG/ML IJ SOLN
2.0000 mg | Freq: Once | INTRAMUSCULAR | Status: AC
  Administered 2024-02-11: 2 mg via INTRAVENOUS
  Filled 2024-02-11: qty 1

## 2024-02-11 MED ORDER — HALOPERIDOL 5 MG PO TABS
5.0000 mg | ORAL_TABLET | Freq: Two times a day (BID) | ORAL | Status: DC
  Filled 2024-02-11: qty 1

## 2024-02-11 MED ORDER — LORAZEPAM 2 MG/ML IJ SOLN
2.0000 mg | Freq: Once | INTRAMUSCULAR | Status: AC
  Administered 2024-02-11: 2 mg via INTRAMUSCULAR
  Filled 2024-02-11: qty 1

## 2024-02-11 NOTE — ED Notes (Signed)
 Pt becoming more agitated, removing all monitoring, and stating, I am going to leave. MD Haviland to bedside to update the pt, but pt still stating he is going to leave. Security called. Pt in room pushing the bed around, yanking on the monitoring equipment, and no complying with police/security commands. Pt eventually allowed medication to be administered IV and is now sitting at the edge of his bed with security outside the room.

## 2024-02-11 NOTE — ED Triage Notes (Signed)
 Pt arrived in [place] PD custody. PD reports that the mother called stating her son was being aggressive, making suicidial/homicidal statements, and was refusing to take his prescribed meds. Upon arrival to room, pt being aggressive, attempting to leave, kicking trash cans, being verbally aggressive, and refusing to obey and commands. Providers to bedside, ordered 4 point restraints, and chemical restraints. Pt refusing to answer any questions, but does state he stopped taking his Risperdal because, That med doesn't work for me and I'm not going to take something that doesn't work.

## 2024-02-11 NOTE — ED Provider Notes (Signed)
  Physical Exam  BP 127/87   Pulse 98   Temp 98 F (36.7 C) (Oral)   Resp 17   SpO2 100%   Physical Exam  Procedures  Procedures  ED Course / MDM   Clinical Course as of 02/11/24 2038  Tue Feb 11, 2024  1450 Medically cleared for TTS eval at this time [CP]    Clinical Course User Index [CP] Rosan Sherlean DEL, PA-C   Medical Decision Making Amount and/or Complexity of Data Reviewed Labs: ordered. Radiology: ordered.  Risk Prescription drug management.   Patient is medically cleared at this time, is requiring sedating medications for his agitation.  He is under IVC.  Psychiatry has evaluated and recommends inpatient psychiatric care.       Neldon Hamp RAMAN, [place] 02/11/24 2039    Dean Clarity, MD 02/11/24 2128
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
## 2024-02-11 NOTE — ED Notes (Signed)
 IVC Case # [id number]

## 2024-02-11 NOTE — Progress Notes (Signed)
 Inpatient Psychiatric Referral  Patient was recommended inpatient per [name], NP  . There are no available beds at [facility], per [facility] . Patient was referred to the following out of network facilities:  Destination  Service Provider Address Phone Fax  [facility]  [address] [place] [address] [phone]  [facility]  [address] [place] [address] [phone]  [facility]  [address], [place] [address] [phone]  [facility]  [address] [place] [address] [phone]  [facility] EFA[facility]  [address] [place], [place] [phone]  [facility]  [address], [place] [address] [phone]  [facility]  [address] [place], [place] [address] [phone]  [facility]  [address] [place] [address] [phone]    Situation ongoing, CSW to continue following and update chart as more information becomes available.  Harrie Sofia MSW, LCSWA 02/11/2024  10:24PM
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
## 2024-02-11 NOTE — ED Notes (Signed)
 Pt arrived already IVC'd by mother. First Exam conducted. All IVC documents e-filed. Envelope Number [id number]. Docs in [place] [facility].
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
## 2024-02-11 NOTE — ED Notes (Addendum)
 IVC'd 02/11/24 @10 :55, exp. 02/17/24 at 10:55a Waiting on case # from [employer] (who is in [employer]) Chief Operating Officer of status, docs in [place], faxed to all BH numbers, copied to medical records, etc

## 2024-02-12 NOTE — ED Provider Notes (Signed)
 Emergency Medicine Observation Re-evaluation Note  [name] is a 43 y.o. male, seen on rounds today.  Pt initially presented to the ED for complaints of Suicidal and Homicidal Patient with history of asthma, schizoaffective disorder, bipolar disorder.  Presented after IVC was taken out by his mother.  Patient very agitated during initial evaluation in the ED requiring sedation.  Psychiatry was consulted and recommends further inpatient management.  Patient under IVC. Currently, the patient is sleeping comfortably in bed, currently out of restraints.  Physical Exam  BP 118/74   Pulse 69   Temp 98.5 F (36.9 C)   Resp 20   SpO2 100%  Physical Exam General: NAD Lungs: Normal effort Psych: Currently calm  ED Course / MDM  EKG:   I have reviewed the labs performed to date as well as medications administered while in observation.  Recent changes in the last 24 hours include patient ministered Geodon  and Ativan  IM yesterday afternoon, no new labs since initial evaluations yesterday.  Plan  Current plan is for inpatient psychiatric management, currently under IVC, awaiting bed at outside facility.  Patient was reportedly accepted at [facility], and given patient was under IVC, nursing called Sheriff to transport him.  I was not made aware that he had been assigned a bed, or that transport had been called, thus I did not complete EMTALA paperwork on the patient or reevaluate him within 60 minutes prior to his transfer.  I was not made aware of his transfer until patient had already left to the emergency department.    Rogelia Jerilynn RAMAN, MD 02/12/24 [phone]

## 2024-02-12 NOTE — ED Notes (Signed)
 Security at bedside

## 2024-02-12 NOTE — Progress Notes (Signed)
 Pt has been accepted to [facility] TODAY 02/12/2024, Bed assignment: Main campus  Pt meets inpatient criteria per: [name] NP  Attending Physician will be Millie Manners, MD  Report can be called to: [phone] (this is a pager, please leave call-back number when giving report)  Pt can arrive after 8 AM  Care Team Notified: Jerel Gravely NP, Rosalita Sanders RN  [place] Glendola Friedhoff LCSW-A   02/12/2024 9:48 AM

## 2024-02-12 NOTE — ED Notes (Signed)
 Pt in no acute distress; cooperative with sheriff.  Steady gait; ambulatory.

## 2024-02-12 NOTE — ED Notes (Signed)
 This RN called staffing about replacement sitter.  Reggie in staffing reports no sitters at this time to send even though patient is IVC'd and very aggressive per previous shift Charity fundraiser.  Duwaine Rummer, charge RN notified and aware as well.  Staffing to send sitter when sitter available; unable to give time to this RN at this time.

## 2024-02-12 NOTE — Progress Notes (Signed)
 LCSW Progress Note  [id number]   [name]  02/12/2024  9:19 AM  Description:   Inpatient Psychiatric Referral  Patient was recommended inpatient per [name] (NP). There are no available beds at [facility], per [facility] [name] RN). Patient was referred to the following out of network facilities:   Destination  Service Provider Address Phone Fax  [facility]  [address], [place] [address] [phone]  [facility]  [address] [place] [address] [phone]  [facility]  [address] [place] [address] [phone]  [facility]  [address], [place] [address] [phone]  [facility] EFAX  [address] [place], [place] [phone]  [facility]  [address], [place] [address] [phone]  [facility]  [address] [place], [place] [address] [phone]  [facility]  [address] [place] [address] [phone]      Situation ongoing, CSW to continue following and update chart as more information becomes available.      [name], MSW, LCSW  02/12/2024 9:19 AM

## 2024-02-12 NOTE — ED Notes (Addendum)
 [place] [employer] Department to transport patient to [facility] per acceptance by [name], NP.  Sheriff Licia and [place] at bedside.

## 2024-02-12 NOTE — ED Notes (Signed)
 This RN called Silvano Potters at [phone] and left voicemail to give report on this patient.  Awaiting call back at this time.

## 2024-02-12 NOTE — ED Notes (Signed)
 [employer] Officer Licia called for transport to [facility]; eta sometime today; gave the officer RN name and telephone number; reported to RN.

## 2024-02-12 NOTE — ED Notes (Signed)
 PT fully covered in bed with blanket from head to toe sleeping at this time.
